# Patient Record
Sex: Female | Born: 1965 | Race: White | Hispanic: No | Marital: Married | Smoking: Former smoker
Health system: Southern US, Community
[De-identification: ages and names within clinical notes are randomized; demographics above are authoritative.]

## PROBLEM LIST (undated history)

## (undated) DIAGNOSIS — E785 Hyperlipidemia, unspecified: Secondary | ICD-10-CM

## (undated) DIAGNOSIS — M7530 Calcific tendinitis of unspecified shoulder: Secondary | ICD-10-CM

## (undated) DIAGNOSIS — S069XAA Unspecified intracranial injury with loss of consciousness status unknown, initial encounter: Secondary | ICD-10-CM

## (undated) DIAGNOSIS — S069X9A Unspecified intracranial injury with loss of consciousness of unspecified duration, initial encounter: Secondary | ICD-10-CM

## (undated) DIAGNOSIS — I2510 Atherosclerotic heart disease of native coronary artery without angina pectoris: Secondary | ICD-10-CM

## (undated) DIAGNOSIS — I2102 ST elevation (STEMI) myocardial infarction involving left anterior descending coronary artery: Secondary | ICD-10-CM

## (undated) DIAGNOSIS — M7520 Bicipital tendinitis, unspecified shoulder: Secondary | ICD-10-CM

## (undated) DIAGNOSIS — M47812 Spondylosis without myelopathy or radiculopathy, cervical region: Secondary | ICD-10-CM

## (undated) DIAGNOSIS — F070 Personality change due to known physiological condition: Secondary | ICD-10-CM

## (undated) DIAGNOSIS — I10 Essential (primary) hypertension: Secondary | ICD-10-CM

## (undated) DIAGNOSIS — I5042 Chronic combined systolic (congestive) and diastolic (congestive) heart failure: Secondary | ICD-10-CM

## (undated) DIAGNOSIS — Z720 Tobacco use: Secondary | ICD-10-CM

## (undated) HISTORY — DX: Bicipital tendinitis, unspecified shoulder: M75.20

## (undated) HISTORY — PX: TUBAL LIGATION: SHX77

## (undated) HISTORY — DX: Unspecified intracranial injury with loss of consciousness of unspecified duration, initial encounter: S06.9X9A

## (undated) HISTORY — DX: Spondylosis without myelopathy or radiculopathy, cervical region: M47.812

## (undated) HISTORY — PX: HERNIA REPAIR: SHX51

## (undated) HISTORY — DX: Calcific tendinitis of unspecified shoulder: M75.30

## (undated) HISTORY — PX: COLON SURGERY: SHX602

## (undated) HISTORY — DX: Unspecified intracranial injury with loss of consciousness status unknown, initial encounter: S06.9XAA

## (undated) HISTORY — DX: Personality change due to known physiological condition: F07.0

---

## 2000-06-07 ENCOUNTER — Emergency Department (HOSPITAL_COMMUNITY): Admission: EM | Admit: 2000-06-07 | Discharge: 2000-06-07 | Payer: Self-pay | Admitting: Emergency Medicine

## 2000-06-07 ENCOUNTER — Encounter: Payer: Self-pay | Admitting: Emergency Medicine

## 2001-10-31 ENCOUNTER — Emergency Department (HOSPITAL_COMMUNITY): Admission: EM | Admit: 2001-10-31 | Discharge: 2001-11-01 | Payer: Self-pay | Admitting: Emergency Medicine

## 2001-11-01 ENCOUNTER — Encounter: Payer: Self-pay | Admitting: Emergency Medicine

## 2004-03-09 ENCOUNTER — Emergency Department (HOSPITAL_COMMUNITY): Admission: EM | Admit: 2004-03-09 | Discharge: 2004-03-09 | Payer: Self-pay | Admitting: Emergency Medicine

## 2004-05-10 ENCOUNTER — Encounter: Admission: RE | Admit: 2004-05-10 | Discharge: 2004-08-08 | Payer: Self-pay | Admitting: Orthopaedic Surgery

## 2006-05-04 ENCOUNTER — Inpatient Hospital Stay (HOSPITAL_COMMUNITY): Admission: AC | Admit: 2006-05-04 | Discharge: 2006-05-18 | Payer: Self-pay

## 2006-05-09 ENCOUNTER — Ambulatory Visit: Payer: Self-pay | Admitting: Physical Medicine & Rehabilitation

## 2006-05-18 ENCOUNTER — Ambulatory Visit: Payer: Self-pay | Admitting: Physical Medicine & Rehabilitation

## 2006-05-18 ENCOUNTER — Inpatient Hospital Stay (HOSPITAL_COMMUNITY)
Admission: RE | Admit: 2006-05-18 | Discharge: 2006-06-21 | Payer: Self-pay | Admitting: Physical Medicine & Rehabilitation

## 2006-05-18 ENCOUNTER — Encounter: Payer: Self-pay | Admitting: Vascular Surgery

## 2006-05-25 ENCOUNTER — Ambulatory Visit: Payer: Self-pay | Admitting: Internal Medicine

## 2006-06-27 ENCOUNTER — Encounter
Admission: RE | Admit: 2006-06-27 | Discharge: 2006-09-25 | Payer: Self-pay | Admitting: Physical Medicine & Rehabilitation

## 2006-07-27 ENCOUNTER — Encounter
Admission: RE | Admit: 2006-07-27 | Discharge: 2006-10-25 | Payer: Self-pay | Admitting: Physical Medicine & Rehabilitation

## 2006-07-27 ENCOUNTER — Ambulatory Visit: Payer: Self-pay | Admitting: Physical Medicine & Rehabilitation

## 2006-08-16 ENCOUNTER — Encounter (HOSPITAL_COMMUNITY)
Admission: RE | Admit: 2006-08-16 | Discharge: 2006-09-15 | Payer: Self-pay | Admitting: Physical Medicine & Rehabilitation

## 2006-09-04 ENCOUNTER — Ambulatory Visit: Payer: Self-pay | Admitting: Physical Medicine & Rehabilitation

## 2006-09-18 ENCOUNTER — Encounter (HOSPITAL_COMMUNITY)
Admission: RE | Admit: 2006-09-18 | Discharge: 2006-10-18 | Payer: Self-pay | Admitting: Physical Medicine & Rehabilitation

## 2006-10-19 ENCOUNTER — Ambulatory Visit: Payer: Self-pay | Admitting: Physical Medicine & Rehabilitation

## 2007-01-22 ENCOUNTER — Encounter
Admission: RE | Admit: 2007-01-22 | Discharge: 2007-02-08 | Payer: Self-pay | Admitting: Physical Medicine & Rehabilitation

## 2007-02-07 ENCOUNTER — Ambulatory Visit: Payer: Self-pay | Admitting: Physical Medicine & Rehabilitation

## 2007-05-07 ENCOUNTER — Encounter
Admission: RE | Admit: 2007-05-07 | Discharge: 2007-07-09 | Payer: Self-pay | Admitting: Physical Medicine & Rehabilitation

## 2007-05-07 ENCOUNTER — Ambulatory Visit: Payer: Self-pay | Admitting: Physical Medicine & Rehabilitation

## 2007-07-29 ENCOUNTER — Encounter
Admission: RE | Admit: 2007-07-29 | Discharge: 2007-07-31 | Payer: Self-pay | Admitting: Physical Medicine & Rehabilitation

## 2007-07-31 ENCOUNTER — Ambulatory Visit: Payer: Self-pay | Admitting: Physical Medicine & Rehabilitation

## 2007-09-25 ENCOUNTER — Ambulatory Visit: Payer: Self-pay | Admitting: Physical Medicine & Rehabilitation

## 2007-09-25 ENCOUNTER — Encounter
Admission: RE | Admit: 2007-09-25 | Discharge: 2007-10-30 | Payer: Self-pay | Admitting: Physical Medicine & Rehabilitation

## 2007-09-29 ENCOUNTER — Ambulatory Visit (HOSPITAL_COMMUNITY)
Admission: RE | Admit: 2007-09-29 | Discharge: 2007-09-29 | Payer: Self-pay | Admitting: Physical Medicine & Rehabilitation

## 2007-10-30 ENCOUNTER — Ambulatory Visit (HOSPITAL_COMMUNITY)
Admission: RE | Admit: 2007-10-30 | Discharge: 2007-10-30 | Payer: Self-pay | Admitting: Physical Medicine & Rehabilitation

## 2007-10-30 ENCOUNTER — Ambulatory Visit: Payer: Self-pay | Admitting: Physical Medicine & Rehabilitation

## 2008-01-03 ENCOUNTER — Encounter
Admission: RE | Admit: 2008-01-03 | Discharge: 2008-01-06 | Payer: Self-pay | Admitting: Physical Medicine & Rehabilitation

## 2008-01-06 ENCOUNTER — Ambulatory Visit: Payer: Self-pay | Admitting: Physical Medicine & Rehabilitation

## 2008-03-02 ENCOUNTER — Encounter
Admission: RE | Admit: 2008-03-02 | Discharge: 2008-03-02 | Payer: Self-pay | Admitting: Physical Medicine & Rehabilitation

## 2008-03-02 ENCOUNTER — Ambulatory Visit: Payer: Self-pay | Admitting: Physical Medicine & Rehabilitation

## 2008-06-02 ENCOUNTER — Encounter
Admission: RE | Admit: 2008-06-02 | Discharge: 2008-06-02 | Payer: Self-pay | Admitting: Physical Medicine & Rehabilitation

## 2008-06-03 ENCOUNTER — Ambulatory Visit: Payer: Self-pay | Admitting: Physical Medicine & Rehabilitation

## 2008-08-26 ENCOUNTER — Encounter
Admission: RE | Admit: 2008-08-26 | Discharge: 2008-08-26 | Payer: Self-pay | Admitting: Physical Medicine & Rehabilitation

## 2008-08-26 ENCOUNTER — Ambulatory Visit: Payer: Self-pay | Admitting: Physical Medicine & Rehabilitation

## 2008-11-24 ENCOUNTER — Encounter
Admission: RE | Admit: 2008-11-24 | Discharge: 2009-02-22 | Payer: Self-pay | Admitting: Physical Medicine & Rehabilitation

## 2008-12-01 ENCOUNTER — Ambulatory Visit: Payer: Self-pay | Admitting: Physical Medicine & Rehabilitation

## 2009-02-22 ENCOUNTER — Encounter
Admission: RE | Admit: 2009-02-22 | Discharge: 2009-04-15 | Payer: Self-pay | Admitting: Physical Medicine & Rehabilitation

## 2009-02-23 ENCOUNTER — Ambulatory Visit: Payer: Self-pay | Admitting: Physical Medicine & Rehabilitation

## 2009-05-24 ENCOUNTER — Encounter
Admission: RE | Admit: 2009-05-24 | Discharge: 2009-05-25 | Payer: Self-pay | Admitting: Physical Medicine & Rehabilitation

## 2009-05-25 ENCOUNTER — Ambulatory Visit: Payer: Self-pay | Admitting: Physical Medicine & Rehabilitation

## 2009-05-25 ENCOUNTER — Ambulatory Visit (HOSPITAL_COMMUNITY)
Admission: RE | Admit: 2009-05-25 | Discharge: 2009-05-25 | Payer: Self-pay | Admitting: Physical Medicine & Rehabilitation

## 2009-09-16 ENCOUNTER — Encounter
Admission: RE | Admit: 2009-09-16 | Discharge: 2009-09-21 | Payer: Self-pay | Admitting: Physical Medicine & Rehabilitation

## 2009-09-21 ENCOUNTER — Ambulatory Visit: Payer: Self-pay | Admitting: Physical Medicine & Rehabilitation

## 2010-01-05 ENCOUNTER — Encounter
Admission: RE | Admit: 2010-01-05 | Discharge: 2010-01-11 | Payer: Self-pay | Source: Home / Self Care | Attending: Physical Medicine & Rehabilitation | Admitting: Physical Medicine & Rehabilitation

## 2010-01-11 ENCOUNTER — Ambulatory Visit: Payer: Self-pay | Admitting: Physical Medicine & Rehabilitation

## 2010-05-03 ENCOUNTER — Encounter
Admission: RE | Admit: 2010-05-03 | Discharge: 2010-05-24 | Payer: Self-pay | Source: Home / Self Care | Attending: Physical Medicine & Rehabilitation | Admitting: Physical Medicine & Rehabilitation

## 2010-05-06 ENCOUNTER — Ambulatory Visit
Admission: RE | Admit: 2010-05-06 | Discharge: 2010-05-06 | Payer: Self-pay | Source: Home / Self Care | Attending: Physical Medicine & Rehabilitation | Admitting: Physical Medicine & Rehabilitation

## 2010-05-15 ENCOUNTER — Encounter: Payer: Self-pay | Admitting: Physical Medicine & Rehabilitation

## 2010-09-06 NOTE — Assessment & Plan Note (Signed)
Julie Simpson is back regarding her brain injury.  She has resumed medications  including Aricept, Effexor and bromocriptine as we indicated at the last  visit.  Husband feels that she has been a bit better initiating more  activities, cooking, housework, etc.  She needs less assistance with  bathing and dressing.  She still has problems with her balance.  She is  using a walker, although there are no wheels with it.  Memory is up and  down.  She still needs her memory book and is using it more.  She has  more problems with trivial information and seemed to remember more  important things that have meaning and substance to her.  She rates her  pain a 9 out of 10 and pains in the neck, mid back and legs.  Her knees  are painful as well.  Pain is described as stabbing, sharp, burning,  constant aching.  Pain interferes with general activity, relations with  others, enjoyment of life on a moderate level.  Sleep is poor.  Trazodone worked initially for her but is no longer helpful.   REVIEW OF SYSTEMS:  Notable for bowel control problems, trouble walking,  spasms, depression, weight gain, constipation, abdominal pain, sleep  apnea.  Other pertinent problems are listed above, and full review is in  the written health and history section of the chart.   SOCIAL HISTORY:  The patient is married, living with her husband.  He  remains extremely supportive.   PHYSICAL EXAMINATION:  Blood pressure is 123/71, pulse is 67,  respiratory rate 18.  She is sating 96% on room air.  The patient is pleasant, more alert, and interactive today.  She needed  extra time for month and year.  She could not remember the day.  She  recalled my name after a few minutes.  She had some remembrance randomly  of medications and responds to some of her review of systems items.  She  has problems with questions such as what did she do yesterday, what are  you doing daily for your activities, etc.  She walks shuffling, wide-  based gait.  She seems to be antalgic on either leg.  She uses a walker  for support.  She tends to lean backward at times.  HEART:  Regular.  CHEST:  Clear.  ABDOMEN:  Soft, nontender.  Cranial nerve exam is grossly intact.  Weight is still up.   ASSESSMENT:  1. Severe traumatic brain injury.  2. Anxiety disorder.  3. Chronic pain in the back, neck, shoulder, knees.  4. Hypertension.  5. Reflux disease.   PLAN:  1. Continue Aricept and Effexor.  2. Will refill hydrocodone which was given to her by her family doctor      at 5/325 one q 6 hours #60.  3. Add Celebrex 200 mg q day for pain/arthritis.  4. Will increase bromocriptine to 10 mg b.i.d.  5. Continue activity, etc.  I asked her husband to work on getting her      wheels for her walker which I think would help her gait.  6. Continue memory book.  7. Will check multiple labs to assess hormonal balance and issues that      might relate to her cognition and continued deficits including      estrogen, progesterone, testosterone, cortisol level, thyroid      function panel, C-met      and CBC.  8. I will see her back in about two months'  time.  Husband will call      with any questions or problems.      Ranelle Oyster, M.D.  Electronically Signed     ZTS/MedQ  D:  07/31/2007 10:15:02  T:  07/31/2007 11:27:23  Job #:  [id number]

## 2010-09-06 NOTE — Assessment & Plan Note (Signed)
FOLLOWUP OFFICE NOTE   Julie Simpson is back regarding traumatic brain injury.  Husband states that she  has been a bit better with her day to day memory at home, but still is  profoundly impaired.  She remains anxious out of the house and depressed  still at home.  She has come off Tegretol without dramatic effects.  He  has decreased her Xanax to twice a day.  She remains on Aricept, as  prescribed by me.  She apparently is off the Provigil now.  She has been  in and out of therapy.  It sounds as if the family is having problems  making ends meet financially and they live day to day.  The patient  walks with her cane.  She still complains of right leg and back pain as  well as neck pain.   REVIEW OF SYSTEMS:  Notable for numbness, trouble walking, restless  sleep, confusion, depression.  Other pertinent positives are mentioned  above.   SOCIAL HISTORY:  As mentioned above.   PHYSICAL EXAM:  Blood pressure 156/81, pulse 62, respiratory rate 18,  she is satting 97% on room air.  The patient is pleasant, alert and oriented x3.  Affect is bright and  appropriate.  She continues to walk with a limp, favoring the right side.  Her cane is  a bit high for her.  We adjusted it downwards and she did better.  Balance is still fair to poor.  She continues to lack significant  insight and awareness.  Short term memory remains very poor.  She cannot  remember my name once again today.  She seemed to recall prior events,  but was not consistent what so ever with these.  She asked about an  event while in the room today and it was something we had discussed on  her last visit back in May.  Cranial nerves exam is generally intact.  Strength is 3+/5 to 4/5 on the right side with inconsistent effort.  HEART:  Regular.  CHEST:  Clear.  ABDOMEN:  Soft and nontender.   ASSESSMENT:  1. 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  2. Anxiety disorder.  3. Right shoulder and  cervical pain.  4. History of tachycardia and hypertension.  5. History of gastroesophageal reflux disease.   PLAN:  1. Continue Aricept for memory.  We will start bromocriptine for      attention and arousal.  2. Replace Lexapro with Effexor going up to 75 mg t.i.d. after 1      week's time.  3. Begin trazodone 50 to 100 mg once again for sleep, as she has come      off of this.  4. Will send the patient for lab work to look at any hormonal or other      metabolic deficiencies including a CBC, CMET, thyroid panel,      hormone panel, B12, and folate level, et Karie Soda.  5. I will see her back in about 3 months' time.  I asked the husband      to call me with any questions.  I will call him with any      information from the lab work.      Ranelle Oyster, M.D.  Electronically Signed    ZTS/MedQ  D:  10/24/2006 11:19:49  T:  10/24/2006 16:30:25  Job #:  [id number]

## 2010-09-06 NOTE — Assessment & Plan Note (Signed)
Julie Simpson is back regarding her brain injury.  Husband states that she  is doing well at home and not been able to get her to therapy due to  change in his work.  He is now having to work 10-hour days, 6 days a  week.  Her MRI of the cervical spine was essentially negative except for  a small bulge at C6-C7.  The patient seems to be doing well at home  although does sleep during the day quite a bit.  She remains anxious and  depressed.  She does use her memory book somewhat.  Her sleep is fair.  The patient rates her pain as 7-8/10 which is down from the 9/10 in  July.   REVIEW OF SYSTEMS:  Notable for numbness, tremor, bladder, or bowel  control issues, for walking and depression.  She does walk with a walker  and/or cane in the house.  Husband has been pushing her and does feel  that her balance has somewhat improved.   SOCIAL HISTORY:  The patient is married and living with her husband.  He  is in a pinch as far as his job is concerned due to the poor economy.   PHYSICAL EXAMINATION:  VITAL SIGNS:  Blood pressure is 119/83, pulse is  96, respiratory rate 18, and sating 95% on room air.  GENERAL:  The patient is pleasant, very flat, however, lacks initiation  and awareness for the most part.  She walks better today.  I found using  her quad cane minimally.  She continues to use a wide-based cane but  seemed more steady and shifting weight better.  She moved her neck with  minimal discomfort today.  HEART:  Regular.  CHEST:  Clear.  ABDOMEN:  Soft and nontender.   ASSESSMENT:  1. Traumatic brain injury.  2. Depression/anxiety disorder.  3. Chronic pain syndrome/centralized pain.  4. Hypertension.   PLAN:  1. Advised increased activity at home and more structure to her      daytime activities.  I suggested making a calendar for weekly      chores.  She also needs to use her memory book regularly.  2. We will start Ritalin 5 mg daily at 7 a.m. and 12 noon.  3. Add Celexa at  night for mood, otherwise she stopped her Effexor.  I      would like to drop her Xanax as well decrease oxycodone.  4. I discussed the fact with the patient that she is going to have to      want to improve if she really has any hopes of improving in      reality.  5. I will see her back in 2 months.      Julie Simpson, M.D.  Electronically Signed     ZTS/MedQ  D:  01/06/2008 12:24:44  T:  01/07/2008 06:44:35  Job #:  [id number]
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
## 2010-09-06 NOTE — Assessment & Plan Note (Signed)
Julie Simpson is back regarding her brain injury.  I last saw her in October.  She comes back off several medications as her husband was afraid that  these were causing nausea.  She is off her Aricept, Effexor,  bromocriptine, and Ultram.  She stopped the trazodone.  Essentially she  is taking her propranolol, diltiazem, lisinopril, and omeprazole.  Omeprazole does not work as well and has run out as well.  She uses  Lidoderm patches p.r.n. with minimal benefits.  Husband reports  decreased mobility and cognition over the last few months' time.  The  patient describes persistent pain, particularly in the neck, left  shoulder, back, and legs.  Husband states that she rarely gets out of  the bed and, when she does, she is limited with her activities around  the home due to physical and cognitive deficits.  The patient rates her  pain an 8/10 today and states it interferes with her general activity,  relations with others, and enjoyment of life on a severe level.  The  patient's husband feels that she is depressed, although she denies this.   REVIEW OF SYSTEMS:  Notable for trouble walking, spasms, bowel control  problems, confusion, depression, weight gain, night sweats,  constipation.  Other pertinent positives are above.   SOCIAL HISTORY:  The patient is married, living with her husband.  Husband works nearby.  Her daughter is coming back home after an  extended psychiatric stay.   PHYSICAL EXAMINATION:  Blood pressure is 135/85, pulse is 68,  respiratory rate 18.  She is satting 97% on room air.  The patient is  pleasant but flat and has poor insight and awareness.  She does have  some improvement in her memory I would say, overall, with some  recollections of some items and activities we discussed today.  She is  fairly good with conversation, although the substance of this  conversation is generally shallow.  She has some pain in her neck with  movement and seems to be fixated on her pain  in the neck and back.  She  moves all 4 extremities.  She did have left shoulder pain with rotator  cuff impingement testing.  HEART:  Regular.  CHEST:  Clear.  ABDOMEN:  Soft, nontender.  STRENGTH:  Generally 4/5 with some giveaway weakness.  She shuffles with  her gait and had difficulty with changes in direction.  She was walking  with her quad cane today.  Weight is still increased.   ASSESSMENT:  1. Severe traumatic brain injury.  2. Anxiety disorder.  3. History of chronic pain in shoulder, neck, back, and knees.  4. Hypertension.  5. Reflux disease.   PLAN:  1. I asked the patient's husband to systematically begin Aricept,      Effexor, bromocriptine and, perhaps, Ultram in that order to see if      any of these were particularly causing her nausea.  I think her      decline in memory and cognition has coincided with the stopping of      these meds.  2. Asked her to keep a memory book with her at all times which she can      initiate the use of.  3. Encouraged modalities such as heat, ice, stretching, massage for      her back and neck.  4. Encouraged activities around the home on a scheduled basis to      improve her movement and activity level.  5.  I will see her back in about 3 months' time.      Ranelle Oyster, M.D.  Electronically Signed     ZTS/MedQ  D:  05/08/2007 13:10:01  T:  05/08/2007 13:54:51  Job #:  [id number]

## 2010-09-06 NOTE — Assessment & Plan Note (Signed)
Julie Simpson is back regarding her brain injury and pain.  She is doing a bit  better with the bromocriptine for memory and mood.  She is using a  memory book a bit more but not entirely consistently.  She had a fall  out of bed the other day in the middle of the night and injured her  back, and she is having more pain there recently.  She rates her pain on  average a 9/10; describes it as a sharp, burning, stabbing, and  constant.  She has pain in the back and to both legs as well as in the  neck and shoulder regions.  Lab work we performed at the last visit was  normal, essentially.  It sounds as though she is going for a thyroid  scan of some sort as her family physician was concerned about  enlargement in the thyroid area.  The patient's mood in general per the  husband seems a bit better.  She is initiating a bit more around the  house but still did not get much in the way of exercise.   REVIEW OF SYSTEMS:  Notable for numbness, trouble walking, spasms,  depression, confusion, anxiety, and weight gain.  Other pertinent  positives are as above.  Full reviews in written health history section  of the chart.   SOCIAL HISTORY:  The patient is married and living with her husband.  She is still smoking half pack per day of cigarettes; her husband  stopped.   PHYSICAL EXAM:  Blood pressure 123/86, pulse 76, respiratory rate 18.  She is sating 97% on room air.  The patient is pleasant and alert.  The patient remembered my name after  some extra time today.  She seemed to have small recollection of events  around the house, some issues, and spoke up for herself on occasions.  Her back is tender to palpation, particularly tender with flexion, more  so with the extension, although both movements caused discomfort today.  She had pain with palpation over the lumbar paraspinal from facets at L4  through S1.  She walks with a wide-based sometimes shuffling gait  pattern still.  She uses a prong cane  for support.  HEART:  Regular.  CHEST:  Clear.  ABDOMEN:  Soft and nontender.  Cranial nerve exam is grossly intact.   I palpated the neck, and there were no obvious masses or nodules.  She  appeared to be somewhat swollen on the left, but this is due to weight  gain and her neck positioning, which essentially was favoring the left  side today.   ASSESSMENT:  1. Traumatic brain injury.  2. Anxiety disorder.  3. 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  4. Hypertension.  5. Reflux disease.   PLAN:  1. We will set her up for an MRI of the lumbar spine.  We will assess      for degenerative disk disease and spondylosis.  2. Continue hydrocodone for breakthrough pain.  3. Continue bromocriptine 10 mg b.i.d. as well as Aricept and Effexor.  4. Encouraged more regular use of the memory book and calendar.  5. We will discuss further treatment of back depending on the MRI      scanning.      Ranelle Oyster, M.D.  Electronically Signed     ZTS/MedQ  D:  09/25/2007 13:23:02  T:  09/26/2007 10:13:00  Job #:  [id number]
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
## 2010-10-31 ENCOUNTER — Encounter: Payer: Medicare Other | Attending: Physical Medicine & Rehabilitation | Admitting: Physical Medicine & Rehabilitation

## 2010-10-31 DIAGNOSIS — F17200 Nicotine dependence, unspecified, uncomplicated: Secondary | ICD-10-CM | POA: Insufficient documentation

## 2010-10-31 DIAGNOSIS — X58XXXS Exposure to other specified factors, sequela: Secondary | ICD-10-CM | POA: Insufficient documentation

## 2010-10-31 DIAGNOSIS — S069X9S Unspecified intracranial injury with loss of consciousness of unspecified duration, sequela: Secondary | ICD-10-CM | POA: Insufficient documentation

## 2010-10-31 DIAGNOSIS — M25569 Pain in unspecified knee: Secondary | ICD-10-CM | POA: Insufficient documentation

## 2010-10-31 DIAGNOSIS — G890 Central pain syndrome: Secondary | ICD-10-CM | POA: Insufficient documentation

## 2010-10-31 DIAGNOSIS — S069X9A Unspecified intracranial injury with loss of consciousness of unspecified duration, initial encounter: Secondary | ICD-10-CM

## 2010-10-31 DIAGNOSIS — F070 Personality change due to known physiological condition: Secondary | ICD-10-CM

## 2010-10-31 DIAGNOSIS — M542 Cervicalgia: Secondary | ICD-10-CM | POA: Insufficient documentation

## 2010-10-31 DIAGNOSIS — S069XAS Unspecified intracranial injury with loss of consciousness status unknown, sequela: Secondary | ICD-10-CM | POA: Insufficient documentation

## 2010-10-31 DIAGNOSIS — M25519 Pain in unspecified shoulder: Secondary | ICD-10-CM | POA: Insufficient documentation

## 2010-10-31 DIAGNOSIS — M47812 Spondylosis without myelopathy or radiculopathy, cervical region: Secondary | ICD-10-CM

## 2010-11-03 NOTE — Assessment & Plan Note (Signed)
Julie Simpson is back regarding her brain injury.  She is still having pain in her left neck and shoulder.  She complains a lot of it at nighttime. She is trying to work somewhat on posture.  Husband states that she has been more active as a whole.  She is still having issues with memory although, she tries to keep some memory aids and husband works repeatedly with her on cues and memory exercises.  She rates her pain overall 8/10 today, describes stabbing, aching.  Pain interferes with general activities, in relations with others, enjoyment of life on a moderate level.  REVIEW OF SYSTEMS:  Notable for trouble walking, spasms, depression, constipation.  Full 12-point review is in the written health and history section of the chart.  SOCIAL HISTORY:  The patient is married, living with her husband.  She is still smoking.  Husband tells me that she is now refraining from any illicit drugs including marijuana.  PHYSICAL EXAMINATION:  VITAL SIGNS:  Blood pressure is 133/81, pulse 68, respiratory rate is 16, she is satting 96% on room air. GENERAL:  The patient is pleasant and alert.  She still walks with somewhat of a shuffling gait. NEUROLOGIC:  Posture was better overall, although she still sits and stands with a head forward position.  She has better focus I felt and awareness overall.  Memory still is inhibited for short-term information.  She has generalized tenderness throughout the left trapezius, sternocleidomastoid, levator scapulae into the rhomboids.  No focal trigger points were noted and no asymmetries from left-to-right really noted today.  Spurling's test was negative.  Compression test had caused some more pain in the left neck.  Her strength in both arms equal as her sensory exam. HEART:  Regular. CHEST:  Clear. ABDOMEN:  Soft, nontender.  ASSESSMENT: 1. Traumatic brain injury, centralized pain syndrome/compression. 2. Bilateral knee pain. 3. Left shoulder girdle  pain/cervicalgia.  Exam suspicious for facet     causes of pain. 4. History of marijuana use.  PLAN: 1. We will order MRI of the cervical spine to rule out lumbar disk     degeneration and facet disease.  I see no radicular signs on     examination today.  The patient may be a candidate for localized     injections based on findings.  We have tried a lot of therapy and     other modalities in the past and we have tried trigger point     injections which provided no relief. 2. Regarding her memory and cognition, we will increase Strattera to     36 mg initially for 4-5 days and then go to 40 mg tablet once a     day.  Hopefully, by improving her attention and arousal, she will     display better short-term memory.  I discussed with Lorece the fact     that her memory will "improve" any further at this point after     brain injury.  We can improve her attention and awareness which     hopefully will help her memory retention. 3. I will see her back pending her studies.     Ranelle Oyster, M.D. Electronically Signed    ZTS/MedQ D:  10/31/2010 12:16:25  T:  10/31/2010 21:46:30  Job #:  [id number]

## 2010-11-07 ENCOUNTER — Other Ambulatory Visit: Payer: Self-pay | Admitting: Physical Medicine & Rehabilitation

## 2010-11-07 DIAGNOSIS — M542 Cervicalgia: Secondary | ICD-10-CM

## 2010-11-07 DIAGNOSIS — M25519 Pain in unspecified shoulder: Secondary | ICD-10-CM

## 2010-11-10 ENCOUNTER — Ambulatory Visit (HOSPITAL_COMMUNITY)
Admission: RE | Admit: 2010-11-10 | Discharge: 2010-11-10 | Disposition: A | Discharge status: Home / Self Care | Payer: Medicare Other | Source: Ambulatory Visit | Attending: Physical Medicine & Rehabilitation | Admitting: Physical Medicine & Rehabilitation

## 2010-11-10 DIAGNOSIS — M5020 Other cervical disc displacement, unspecified cervical region: Secondary | ICD-10-CM | POA: Insufficient documentation

## 2010-11-10 DIAGNOSIS — M542 Cervicalgia: Secondary | ICD-10-CM | POA: Insufficient documentation

## 2010-11-10 DIAGNOSIS — M5030 Other cervical disc degeneration, unspecified cervical region: Secondary | ICD-10-CM | POA: Insufficient documentation

## 2010-11-10 DIAGNOSIS — M25519 Pain in unspecified shoulder: Secondary | ICD-10-CM | POA: Insufficient documentation

## 2011-04-26 ENCOUNTER — Encounter: Payer: Medicare Other | Attending: Physical Medicine & Rehabilitation | Admitting: Physical Medicine & Rehabilitation

## 2011-04-26 DIAGNOSIS — G890 Central pain syndrome: Secondary | ICD-10-CM | POA: Insufficient documentation

## 2011-04-26 DIAGNOSIS — F17200 Nicotine dependence, unspecified, uncomplicated: Secondary | ICD-10-CM | POA: Insufficient documentation

## 2011-04-26 DIAGNOSIS — F3289 Other specified depressive episodes: Secondary | ICD-10-CM | POA: Insufficient documentation

## 2011-04-26 DIAGNOSIS — S069X9A Unspecified intracranial injury with loss of consciousness of unspecified duration, initial encounter: Secondary | ICD-10-CM

## 2011-04-26 DIAGNOSIS — X58XXXS Exposure to other specified factors, sequela: Secondary | ICD-10-CM | POA: Insufficient documentation

## 2011-04-26 DIAGNOSIS — S069XAS Unspecified intracranial injury with loss of consciousness status unknown, sequela: Secondary | ICD-10-CM | POA: Insufficient documentation

## 2011-04-26 DIAGNOSIS — M7650 Patellar tendinitis, unspecified knee: Secondary | ICD-10-CM

## 2011-04-26 DIAGNOSIS — M25569 Pain in unspecified knee: Secondary | ICD-10-CM | POA: Insufficient documentation

## 2011-04-26 DIAGNOSIS — S069X9S Unspecified intracranial injury with loss of consciousness of unspecified duration, sequela: Secondary | ICD-10-CM | POA: Insufficient documentation

## 2011-04-26 DIAGNOSIS — M1710 Unilateral primary osteoarthritis, unspecified knee: Secondary | ICD-10-CM

## 2011-04-26 DIAGNOSIS — F329 Major depressive disorder, single episode, unspecified: Secondary | ICD-10-CM | POA: Insufficient documentation

## 2011-04-27 NOTE — Assessment & Plan Note (Signed)
Julie Simpson is back regarding her multiple issues.  I last saw her in July. Her biggest complaint today is knee pain.  She is trying to walk more with the knees which is too tender.  We had done x-rays about 2 years ago which were really unremarkable.  She is using some Bengay and some topical treatments at home which only provides minimal results.  Husband tries to push her to move and exercise more and she is doing a bit better as a whole, but still is not very active.  Still has ongoing issues with memory and tries to use a memory book, but has a hard time keeping up with them.  REVIEW OF SYSTEMS:  Notable for the above.  Full 12-point review is in the written health and history section of the chart.  Mood perhaps has been a bit better.  SOCIAL HISTORY:  The patient is married.  Husband is with her today. She is still smoking a pack of cigarettes per day.  PHYSICAL EXAMINATION:  VITAL SIGNS:  Blood pressure 142/85, pulse 66, respiratory rate 16, she is satting 94% on room air. GENERAL:  The patient is pleasant, alert, and oriented.  She needs cuing though for short-term memory issues and attention.  I had her stand for me today and she needed extra time to go from seated to standing due to pain.  There is a mild joint effusion on the right knee and minimal crepitus seen.  There was some mild joint line tenderness with palpation.  Valgus and varus stresses as well as meniscal stresses did not seem to overly provoke pain response.  She seemed to have more pain superiorly along the quad tendon and bursa.  The patient walks with knee and uses a wide-based gait and is bit apprehensive and anxious as a whole. HEART:  Regular. CHEST:  Clear. ABDOMEN:  Soft, nontender.  She has gained considerable weight as a whole.  ASSESSMENT: 1. Traumatic brain injury, centralized pain syndrome, and depression. 2. Bilateral knee pain, which may be quadriceps, tendinitis/bursitis.  PLAN:  Given that she  had completely normal x-rays from 2 years ago and her history of pain sensitization, I would like to stay conservative here.  Voltaren gel would be a good alternative for her, but financially their plan will not cover this.  I decided to write her naproxen 500 mg b.i.d. with food for now.  Recommended ice t.i.d. and as well as strengthening exercises for the quadriceps, hamstrings in legs in general.  If she fails to progress, we can look at other options including injection and further diagnostic workup.  I will see her back in 6 weeks.     Ranelle Oyster, M.D. Electronically Signed    ZTS/MedQ D:  04/26/2011 11:46:32  T:  04/26/2011 20:11:20  Job #:  [id number]

## 2011-06-06 ENCOUNTER — Encounter: Payer: Medicare Other | Attending: Physical Medicine & Rehabilitation | Admitting: Physical Medicine & Rehabilitation

## 2011-06-06 DIAGNOSIS — M542 Cervicalgia: Secondary | ICD-10-CM | POA: Insufficient documentation

## 2011-06-06 DIAGNOSIS — M47812 Spondylosis without myelopathy or radiculopathy, cervical region: Secondary | ICD-10-CM

## 2011-06-06 DIAGNOSIS — M7650 Patellar tendinitis, unspecified knee: Secondary | ICD-10-CM | POA: Insufficient documentation

## 2011-06-06 DIAGNOSIS — M25569 Pain in unspecified knee: Secondary | ICD-10-CM | POA: Insufficient documentation

## 2011-06-06 DIAGNOSIS — F070 Personality change due to known physiological condition: Secondary | ICD-10-CM

## 2011-06-06 DIAGNOSIS — Z87820 Personal history of traumatic brain injury: Secondary | ICD-10-CM | POA: Insufficient documentation

## 2011-06-06 DIAGNOSIS — S069X9A Unspecified intracranial injury with loss of consciousness of unspecified duration, initial encounter: Secondary | ICD-10-CM

## 2011-06-06 DIAGNOSIS — G890 Central pain syndrome: Secondary | ICD-10-CM | POA: Insufficient documentation

## 2011-06-09 NOTE — Assessment & Plan Note (Signed)
Misty Stanley is back regarding her multiple pain complaints.  She comes back with knee pain again.  The gel helps somewhat.  She is trying to work on some exercises at home, was not consistent with these.  She notes a lot of pain in both knees especially when she sits for long periods of time or has to go up inclines, steps, etc.  Her pain is an 8/10, described as stabbing and aching.  REVIEW OF SYSTEMS:  Notable for trouble walking, weight gain, easy bleeding.  Full 12-point review is in the written health and history section of the chart.  SOCIAL HISTORY:  The patient is married, living with her husband.  She is smoking a pack of cigarettes per day.  PHYSICAL EXAMINATION:  VITAL SIGNS:  Blood pressure is 132/79, pulse 68, respiratory rate is 16, and she is saturating 98% on room air. GENERAL:  The patient is pleasant, alert and oriented x3.  Affect is generally bright and appropriate. MUSCULOSKELETAL:  She has minimal joint effusions that I could see on either knee.  No crepitus was seen.  She has minimal joint line pain. Meniscal maneuvers were negative.  She did have pain with resisted knee extension and with pain upon the palpation of the patellar tendon in the infrapatellar regions.  Quad tendon area seemed to be less tender today. Generally, her strength is a bit diminished with extension at 3+ to 4/5. She is 4/5 elsewhere essentially, but inconsistent. HEART:  Regular. CHEST:  Clear. ABDOMEN:  Soft and nontender. NEUROLOGIC:  She is unchanged.  ASSESSMENT: 1. History of traumatic brain injury. 2. Bilateral knee pain with patellar tendinitis, likely some     quadriceps tendinitis, which has improved on exam today.  No     evidence of osteoarthritis in the knees. 3. Centralized pain syndrome. 4. Chronic cervicalgia.  PLAN: 1. After informed consent, we injected both patellar tendons in     infrapatellar region with 40 mg Kenalog and 3 mL of 1% lidocaine.     The patient  tolerated it well.  I expressed to the patient and her     husband the importance of strengthening her knee musculature,     particularly the quadriceps.  She needs to work on walking and     closed kinetic chain exercises as well as some open kinetic chains     against gravity.  Ice would be valuable as well as her Voltaren.     They have a walking machine apparently  that they are acquiring.     Although, I told she needs to be careful when using this from a     fall risk standpoint. 2. I will see her back in about 2 months.  They will call me with any     problems or questions in the interim.     Ranelle Oyster, M.D. Electronically Signed    ZTS/MedQ D:  06/06/2011 10:38:24  T:  06/06/2011 12:02:41  Job #:  [id number]

## 2011-08-02 ENCOUNTER — Encounter: Payer: Self-pay | Admitting: Physical Medicine & Rehabilitation

## 2011-08-02 ENCOUNTER — Encounter: Payer: Medicare Other | Attending: Physical Medicine & Rehabilitation | Admitting: Physical Medicine & Rehabilitation

## 2011-08-02 VITALS — BP 152/94 | HR 70 | Resp 16 | Ht <= 58 in | Wt 126.0 lb

## 2011-08-02 DIAGNOSIS — M25569 Pain in unspecified knee: Secondary | ICD-10-CM | POA: Insufficient documentation

## 2011-08-02 DIAGNOSIS — S069X9A Unspecified intracranial injury with loss of consciousness of unspecified duration, initial encounter: Secondary | ICD-10-CM | POA: Insufficient documentation

## 2011-08-02 DIAGNOSIS — X58XXXS Exposure to other specified factors, sequela: Secondary | ICD-10-CM | POA: Insufficient documentation

## 2011-08-02 DIAGNOSIS — M76899 Other specified enthesopathies of unspecified lower limb, excluding foot: Secondary | ICD-10-CM

## 2011-08-02 DIAGNOSIS — G890 Central pain syndrome: Secondary | ICD-10-CM | POA: Insufficient documentation

## 2011-08-02 DIAGNOSIS — M7060 Trochanteric bursitis, unspecified hip: Secondary | ICD-10-CM | POA: Insufficient documentation

## 2011-08-02 DIAGNOSIS — F3289 Other specified depressive episodes: Secondary | ICD-10-CM | POA: Insufficient documentation

## 2011-08-02 DIAGNOSIS — S069XAS Unspecified intracranial injury with loss of consciousness status unknown, sequela: Secondary | ICD-10-CM | POA: Insufficient documentation

## 2011-08-02 DIAGNOSIS — F329 Major depressive disorder, single episode, unspecified: Secondary | ICD-10-CM | POA: Insufficient documentation

## 2011-08-02 DIAGNOSIS — G8929 Other chronic pain: Secondary | ICD-10-CM | POA: Insufficient documentation

## 2011-08-02 DIAGNOSIS — S069X9S Unspecified intracranial injury with loss of consciousness of unspecified duration, sequela: Secondary | ICD-10-CM | POA: Insufficient documentation

## 2011-08-02 DIAGNOSIS — M7650 Patellar tendinitis, unspecified knee: Secondary | ICD-10-CM | POA: Insufficient documentation

## 2011-08-02 DIAGNOSIS — F17200 Nicotine dependence, unspecified, uncomplicated: Secondary | ICD-10-CM | POA: Insufficient documentation

## 2011-08-02 NOTE — Patient Instructions (Signed)

## 2011-08-02 NOTE — Progress Notes (Signed)
Subjective:    Patient ID: Julie Simpson, female    DOB: 1965-05-16, 46 y.o.   MRN: [id number]  HPI Kambri is back regarding her multiple pain issues, TBI.  She had good results with the bilateral peri-tendinous patellar tendon injections.  Knees are still feeling better since the injections.  She is using the voltaren gel as well.  Ice can be helpful as well.  She is trying to walk more. She walks a lot in the house, but doesn't get out of the house much.  She has lost weight as well. She usually walks with her cane and sometimes a walker.    She reports pain on the right side of her buttocks. This has been present since her injury.  She tries some stretching but it doesn't help much. She hasn't used ice.  Sleeping causes that pain to be much worse.  Cognition is at baseline   Pain Inventory Average Pain 8 Pain Right Now 8 My pain is intermittent, burning, stabbing and aching  In the last 24 hours, has pain interfered with the following? General activity 4 Relation with others 5 Enjoyment of life 4 What TIME of day is your pain at its worst? all of the time Sleep (in general) Fair  Pain is worse with: walking, unsure and some activites Pain improves with: rest, heat/ice and pacing activities Relief from Meds: 3  Mobility use a cane ability to climb steps?  yes do you drive?  no needs help with transfers Do you have any goals in this area?  yes  Function disabled: date disabled 2008 I need assistance with the following:  meal prep, household duties and shopping  Neuro/Psych bowel control problems trouble walking depression  Prior Studies Any changes since last visit?  no  Physicians involved in your care Any changes since last visit?  no     Review of Systems  Constitutional: Positive for unexpected weight change.       Weight gain  Gastrointestinal: Positive for constipation.  Musculoskeletal: Positive for gait problem.  Psychiatric/Behavioral: Positive for  dysphoric mood.  All other systems reviewed and are negative.       Objective:   Physical Exam  Constitutional: She appears well-developed and well-nourished.  HENT:  Head: Normocephalic and atraumatic.  Eyes: Conjunctivae and EOM are normal. Pupils are equal, round, and reactive to light.  Neck: Normal range of motion. Neck supple.  Cardiovascular: Normal rate and regular rhythm.   Pulmonary/Chest: Effort normal and breath sounds normal.  Abdominal: Soft. Bowel sounds are normal.  Musculoskeletal:       Right hip: She exhibits decreased range of motion, decreased strength, tenderness and bony tenderness.       Right knee: She exhibits no swelling. tenderness found.       Left knee: She exhibits no swelling. tenderness found.  Neurological: She is alert.  Psychiatric: Her speech is normal and behavior is normal. Thought content normal. Her affect is blunt. Cognition and memory are impaired. She exhibits abnormal recent memory.          Assessment & Plan:  ASSESSMENT:  1. History of traumatic brain injury.  2. Bilateral knee pain with patellar tendinitis, likely some  quadriceps tendinitis, which has improved greatly with injections 3. Centralized pain syndrome.  4. Chronic cervicalgia.  5. Right greater trochanter bursitis  PLAN:  1. Continue strengthing exercises for lower extremities. Continue ice and voltaren gel 2. Patient was provided info and exercises for her greater trochanter.  Stretching  is important here as well.  2. I will see her back in about 3 months. They will call me with any  problems or questions in the interim.

## 2011-08-03 ENCOUNTER — Encounter: Payer: Self-pay | Admitting: Physical Medicine & Rehabilitation

## 2011-09-06 ENCOUNTER — Other Ambulatory Visit: Payer: Self-pay

## 2011-09-06 MED ORDER — NAPROXEN 500 MG PO TABS: 500.0000 mg | ORAL_TABLET | Freq: Two times a day (BID) | ORAL | Status: DC

## 2011-09-08 ENCOUNTER — Other Ambulatory Visit: Payer: Self-pay | Admitting: *Deleted

## 2011-09-08 MED ORDER — NAPROXEN 500 MG PO TABS: 500.0000 mg | ORAL_TABLET | Freq: Two times a day (BID) | ORAL | Status: DC

## 2011-11-01 ENCOUNTER — Encounter: Payer: Medicare Other | Attending: Physical Medicine & Rehabilitation | Admitting: Physical Medicine & Rehabilitation

## 2011-11-01 ENCOUNTER — Encounter: Payer: Self-pay | Admitting: Physical Medicine & Rehabilitation

## 2011-11-01 VITALS — BP 143/86 | HR 91 | Resp 14 | Ht <= 58 in | Wt 129.0 lb

## 2011-11-01 DIAGNOSIS — IMO0002 Reserved for concepts with insufficient information to code with codable children: Secondary | ICD-10-CM

## 2011-11-01 DIAGNOSIS — M1710 Unilateral primary osteoarthritis, unspecified knee: Secondary | ICD-10-CM

## 2011-11-01 DIAGNOSIS — M7060 Trochanteric bursitis, unspecified hip: Secondary | ICD-10-CM

## 2011-11-01 DIAGNOSIS — M76899 Other specified enthesopathies of unspecified lower limb, excluding foot: Secondary | ICD-10-CM

## 2011-11-01 DIAGNOSIS — S069XAA Unspecified intracranial injury with loss of consciousness status unknown, initial encounter: Secondary | ICD-10-CM

## 2011-11-01 DIAGNOSIS — F329 Major depressive disorder, single episode, unspecified: Secondary | ICD-10-CM | POA: Insufficient documentation

## 2011-11-01 DIAGNOSIS — F3289 Other specified depressive episodes: Secondary | ICD-10-CM | POA: Insufficient documentation

## 2011-11-01 DIAGNOSIS — M25569 Pain in unspecified knee: Secondary | ICD-10-CM | POA: Insufficient documentation

## 2011-11-01 DIAGNOSIS — G890 Central pain syndrome: Secondary | ICD-10-CM | POA: Insufficient documentation

## 2011-11-01 DIAGNOSIS — S069XAS Unspecified intracranial injury with loss of consciousness status unknown, sequela: Secondary | ICD-10-CM | POA: Insufficient documentation

## 2011-11-01 DIAGNOSIS — M7650 Patellar tendinitis, unspecified knee: Secondary | ICD-10-CM

## 2011-11-01 DIAGNOSIS — F17200 Nicotine dependence, unspecified, uncomplicated: Secondary | ICD-10-CM | POA: Insufficient documentation

## 2011-11-01 DIAGNOSIS — X58XXXS Exposure to other specified factors, sequela: Secondary | ICD-10-CM | POA: Insufficient documentation

## 2011-11-01 DIAGNOSIS — G8929 Other chronic pain: Secondary | ICD-10-CM

## 2011-11-01 DIAGNOSIS — S069X9A Unspecified intracranial injury with loss of consciousness of unspecified duration, initial encounter: Secondary | ICD-10-CM

## 2011-11-01 DIAGNOSIS — S069X9S Unspecified intracranial injury with loss of consciousness of unspecified duration, sequela: Secondary | ICD-10-CM | POA: Insufficient documentation

## 2011-11-01 NOTE — Progress Notes (Deleted)
  Subjective:    Patient ID: Julie Simpson, female    DOB: 06-11-1965, 46 y.o.   MRN: [id number]  HPI Pain Inventory Average Pain {NUMBERS; 0-10:5044} Pain Right Now {NUMBERS; 0-10:5044} My pain is {PAIN DESCRIPTION:21022940}  In the last 24 hours, has pain interfered with the following? General activity {NUMBERS; 0-10:5044} Relation with others {NUMBERS; 0-10:5044} Enjoyment of life {NUMBERS; 0-10:5044} What TIME of day is your pain at its worst? {TIME OF WUJ:81191478} Sleep (in general) {BHH GOOD/FAIR/POOR:22877}  Pain is worse with: {ACTIVITIES:21022942} Pain improves with: {PAIN IMPROVES GNFA:21308657} Relief from Meds: {NUMBERS; 0-10:5044}  Mobility {MOBILITY QIO:96295284}  Function {FUNCTION:21022946}  Neuro/Psych {NEURO/PSYCH:21022948}  Prior Studies {CPRM PRIOR STUDIES:21022953}  Physicians involved in your care {CPRM PHYSICIANS INVOLVED IN YOUR CARE:21022954}   Family History  Problem Relation Age of Onset  . Cancer Mother 103    brain tumor  . Diabetes Father    History   Social History  . Marital Status: Married    Spouse Name: N/A    Number of Children: N/A  . Years of Education: N/A   Social History Main Topics  . Smoking status: Current Everyday Smoker -- 1.0 packs/day  . Smokeless tobacco: Never Used  . Alcohol Use: None  . Drug Use: None  . Sexually Active: None   Other Topics Concern  . None   Social History Narrative  . None   Past Surgical History  Procedure Date  . Hernia repair   . Colon surgery     polypectomy  . Tubal ligation    Past Medical History  Diagnosis Date  . 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 Personality change due to conditions classified elsewhere   . Calcifying tendinitis of shoulder   . Bicipital tenosynovitis   . Cervical spondylosis without myelopathy    There were no vitals taken for this  visit.      Review of Systems     Objective:   Physical Exam  Constitutional: She appears well-developed and well-nourished.  HENT:  Head: Normocephalic and atraumatic.  Eyes: Conjunctivae and EOM are normal. Pupils are equal, round, and reactive to light.  Neck: Normal range of motion. Neck supple.  Cardiovascular: Normal rate and regular rhythm.  Pulmonary/Chest: Effort normal and breath sounds normal.  Abdominal: Soft. Bowel sounds are normal.  Musculoskeletal:  Right hip: She exhibits decreased range of motion, decreased strength, tenderness and bony tenderness.  Right knee: She exhibits no swelling. tenderness found.  Left knee: She exhibits no swelling. tenderness found.  Neurological: She is alert.  Psychiatric: Her speech is normal and behavior is normal. Thought content normal. Her affect is blunt. Cognition and memory are impaired. She exhibits abnormal recent memory.  Assessment & Plan:   ASSESSMENT:  1. History of traumatic brain injury.  2. Bilateral knee pain with patellar tendinitis, likely some  quadriceps tendinitis, which has improved greatly with injections  3. Centralized pain syndrome.  4. Chronic cervicalgia.  5. Right greater trochanter bursitis  PLAN:  1. Continue strengthing exercises for lower extremities. Continue ice and voltaren gel  2. Patient was provided info and exercises for her greater trochanter. Stretching is important here as well.  2. I will see her back in about 3 months. They will call me with any  problems or questions in the interim.       Assessment & Plan:

## 2011-11-01 NOTE — Patient Instructions (Signed)
YOU NEED TO CONTINUE WITH YOUR RANGE OF MOTION AND STRENGTHENING EXERCISES FOR YOUR KNEES.   NEOPRENE KNEE SLEEVE WOULD BE HELPFUL   GLUCOSAMINE AND CHRONDROITIN FOR KNEE PAIN. TAKE AS DIRECTED.

## 2011-11-01 NOTE — Progress Notes (Signed)
Subjective:    Patient ID: Julie Simpson, female    DOB: Apr 21, 1966, 46 y.o.   MRN: [id number]  HPI  Alyssamae is back regarding her chronic pain, BI. She has been trying to exercise more. She uses a quad cane for balance. He knees remain a source of pain. She is limited by her cognitive abilities and safety still. She continues on naproxen for pain relief.  No other health problems reported  Pain Inventory Average Pain 8 Pain Right Now 8 My pain is sharp, stabbing and aching  In the last 24 hours, has pain interfered with the following? General activity 4 Relation with others 6 Enjoyment of life 4 What TIME of day is your pain at its worst? all the time Sleep (in general) Fair  Pain is worse with: bending, sitting, inactivity, standing and some activites Pain improves with: rest and pacing activities Relief from Meds: 5  Mobility use a cane ability to climb steps?  yes do you drive?  no needs help with transfers  Function disabled: date disabled  Do you have any goals in this area?  yes  Neuro/Psych weakness trouble walking confusion depression  Prior Studies Any changes since last visit?  no  Physicians involved in your care Any changes since last visit?  no   Family History  Problem Relation Age of Onset  . Cancer Mother 69    brain tumor  . Diabetes Father    History   Social History  . Marital Status: Married    Spouse Name: N/A    Number of Children: N/A  . Years of Education: N/A   Social History Main Topics  . Smoking status: Current Everyday Smoker -- 1.0 packs/day  . Smokeless tobacco: Never Used  . Alcohol Use: None  . Drug Use: None  . Sexually Active: None   Other Topics Concern  . None   Social History Narrative  . None   Past Surgical History  Procedure Date  . Hernia repair   . Colon surgery     polypectomy  . Tubal ligation    Past Medical History  Diagnosis Date  . Intracranial injury of other and unspecified nature,  without mention of open intracranial wound, unspecified state of consciousness   . Personality change due to conditions classified elsewhere   . Calcifying tendinitis of shoulder   . Bicipital tenosynovitis   . Cervical spondylosis without myelopathy    BP 143/86  Pulse 91  Resp 14  Ht 4\' 10"  (1.473 m)  Wt 129 lb (58.514 kg)  BMI 26.96 kg/m2  SpO2 96%     Review of Systems  Constitutional: Positive for diaphoresis.  Respiratory: Positive for apnea.   Musculoskeletal: Positive for myalgias, arthralgias and gait problem.  Neurological: Positive for weakness.  Psychiatric/Behavioral: Positive for confusion and dysphoric mood.  All other systems reviewed and are negative.       Objective:   Physical Exam          Constitutional: She appears well-developed and well-nourished.  HENT:  Head: Normocephalic and atraumatic.  Eyes: Conjunctivae and EOM are normal. Pupils are equal, round, and reactive to light.  Neck: Normal range of motion. Neck supple.  Cardiovascular: Normal rate and regular rhythm.  Pulmonary/Chest: Effort normal and breath sounds normal.  Abdominal: Soft. Bowel sounds are normal.  Musculoskeletal:  Right hip: She exhibits decreased range of motion, decreased strength, tenderness and bony tenderness. Right knee: She exhibits no swelling. tenderness found along joint line. Minimal crepitus  Left knee: She exhibits no swelling. tenderness found along joint line. minmal crepitus Neurological: She is alert.  Psychiatric: Her speech is normal and behavior is normal. Thought content normal. Her affect is blunt. Cognition and memory are impaired. She exhibits abnormal recent memory.    Assessment & Plan:   ASSESSMENT:  1. History of traumatic brain injury.  2. Bilateral knee pain with patellar tendinitis, and likely small meniscal injuries 3. Centralized pain syndrome.  4. Chronic cervicalgia.  5. Right greater trochanter bursitis  PLAN:  1. Continue  strengthing exercises for lower extremities. Continue ice and voltaren gel. Her quads remain weak as are her other leg muscles. Discused glucosamine and chrondoitin for knees as well. 2. We discussed an MRI, but i'm not sure how it will change her management at this point. I did recommend a neoprene knee sleeve as well.  2. I will see her back in about 3 months. They will call me with any  problems or questions in the interim.

## 2012-02-26 ENCOUNTER — Encounter: Payer: Self-pay | Admitting: Physical Medicine & Rehabilitation

## 2012-02-26 ENCOUNTER — Encounter: Payer: Medicare Other | Attending: Physical Medicine & Rehabilitation | Admitting: Physical Medicine & Rehabilitation

## 2012-02-26 ENCOUNTER — Other Ambulatory Visit: Payer: Self-pay | Admitting: Physical Medicine & Rehabilitation

## 2012-02-26 VITALS — BP 140/83 | HR 73 | Resp 14 | Ht <= 58 in | Wt 126.0 lb

## 2012-02-26 DIAGNOSIS — R4189 Other symptoms and signs involving cognitive functions and awareness: Secondary | ICD-10-CM | POA: Insufficient documentation

## 2012-02-26 DIAGNOSIS — S069XAS Unspecified intracranial injury with loss of consciousness status unknown, sequela: Secondary | ICD-10-CM | POA: Insufficient documentation

## 2012-02-26 DIAGNOSIS — M542 Cervicalgia: Secondary | ICD-10-CM | POA: Insufficient documentation

## 2012-02-26 DIAGNOSIS — S069X9A Unspecified intracranial injury with loss of consciousness of unspecified duration, initial encounter: Secondary | ICD-10-CM

## 2012-02-26 DIAGNOSIS — M1710 Unilateral primary osteoarthritis, unspecified knee: Secondary | ICD-10-CM

## 2012-02-26 DIAGNOSIS — E039 Hypothyroidism, unspecified: Secondary | ICD-10-CM

## 2012-02-26 DIAGNOSIS — M25569 Pain in unspecified knee: Secondary | ICD-10-CM | POA: Insufficient documentation

## 2012-02-26 DIAGNOSIS — M7650 Patellar tendinitis, unspecified knee: Secondary | ICD-10-CM | POA: Insufficient documentation

## 2012-02-26 DIAGNOSIS — S069X9S Unspecified intracranial injury with loss of consciousness of unspecified duration, sequela: Secondary | ICD-10-CM | POA: Insufficient documentation

## 2012-02-26 DIAGNOSIS — X58XXXS Exposure to other specified factors, sequela: Secondary | ICD-10-CM | POA: Insufficient documentation

## 2012-02-26 DIAGNOSIS — G8929 Other chronic pain: Secondary | ICD-10-CM | POA: Insufficient documentation

## 2012-02-26 DIAGNOSIS — M76899 Other specified enthesopathies of unspecified lower limb, excluding foot: Secondary | ICD-10-CM | POA: Insufficient documentation

## 2012-02-26 DIAGNOSIS — G890 Central pain syndrome: Secondary | ICD-10-CM | POA: Insufficient documentation

## 2012-02-26 LAB — COMPREHENSIVE METABOLIC PANEL
AST: 16 U/L (ref 0–37)
Albumin: 4.3 g/dL (ref 3.5–5.2)
CO2: 25 mEq/L (ref 19–32)
Calcium: 9.3 mg/dL (ref 8.4–10.5)
Creat: 0.64 mg/dL (ref 0.50–1.10)
Potassium: 4.4 mEq/L (ref 3.5–5.3)
Total Bilirubin: 0.3 mg/dL (ref 0.3–1.2)
Total Protein: 6.5 g/dL (ref 6.0–8.3)

## 2012-02-26 LAB — TESTOSTERONE: Testosterone: 43.87 ng/dL (ref 10–70)

## 2012-02-26 LAB — CBC
HCT: 46.7 % — ABNORMAL HIGH (ref 36.0–46.0)
Hemoglobin: 16.4 g/dL — ABNORMAL HIGH (ref 12.0–15.0)
MCH: 32.8 pg (ref 26.0–34.0)
MCHC: 35.1 g/dL (ref 30.0–36.0)
MCV: 93.4 fL (ref 78.0–100.0)
RDW: 13.3 % (ref 11.5–15.5)

## 2012-02-26 LAB — T4: T4, Total: 7.2 ug/dL (ref 5.0–12.5)

## 2012-02-26 MED ORDER — METHYLPHENIDATE HCL ER 10 MG PO TBCR: 10.0000 mg | EXTENDED_RELEASE_TABLET | ORAL | Status: DC

## 2012-02-26 NOTE — Progress Notes (Deleted)
  Subjective:    Patient ID: Julie Simpson, female    DOB: 09/21/1965, 45 y.o.   MRN: [id number]  HPI    Review of Systems     Objective:   Physical Exam        Assessment & Plan:

## 2012-02-26 NOTE — Patient Instructions (Signed)
YOU NEED TO KEEP A MEMORY BOOK AND USE IT EACH AND EVERY DAY!!!!!!!!!

## 2012-02-26 NOTE — Progress Notes (Signed)
Subjective:    Patient ID: Julie Simpson, female    DOB: [birth year], 47 y.o.   MRN: [id number]  HPI  Julie Simpson is back regarding her TBI. For the most part things have been about the same. She is able to function within the house on a limited level. She continues to have poor memory and attention. She only exercises really when her husband gets her out. She has chronic pain at her knees, back and neck.    Pain Inventory Average Pain 8 Pain Right Now 8 My pain is sharp, stabbing and aching  In the last 24 hours, has pain interfered with the following? General activity 5 Relation with others 6 Enjoyment of life 3 What TIME of day is your pain at its worst? all the time Sleep (in general) Fair  Pain is worse with: walking and some activites Pain improves with: medication Relief from Meds: 5  Mobility use a cane how many minutes can you walk? 5 ability to climb steps?  yes do you drive?  no needs help with transfers  Function disabled: date disabled  I need assistance with the following:  meal prep and household duties  Neuro/Psych trouble walking spasms confusion depression anxiety  Prior Studies Any changes since last visit?  no  Physicians involved in your care Any changes since last visit?  no   Family History  Problem Relation Age of Onset  . Cancer Mother 22    brain tumor  . Diabetes Father    History   Social History  . Marital Status: Married    Spouse Name: N/A    Number of Children: N/A  . Years of Education: N/A   Social History Main Topics  . Smoking status: Current Every Day Smoker -- 1.0 packs/day  . Smokeless tobacco: Never Used  . Alcohol Use: None  . Drug Use: None  . Sexually Active: None   Other Topics Concern  . None   Social History Narrative  . None   Past Surgical History  Procedure Date  . Hernia repair   . Colon surgery     polypectomy  . Tubal ligation    Past Medical History  Diagnosis Date  . Intracranial  injury of other and unspecified nature, without mention of open intracranial wound, unspecified state of consciousness   . Personality change due to conditions classified elsewhere   . Calcifying tendinitis of shoulder   . Bicipital tenosynovitis   . Cervical spondylosis without myelopathy    BP 140/83  Pulse 73  Resp 14  Ht 4\' 10"  (1.473 m)  Wt 126 lb (57.153 kg)  BMI 26.33 kg/m2  SpO2 95%     Review of Systems  Gastrointestinal: Positive for constipation.  Musculoskeletal: Positive for myalgias, arthralgias and gait problem.  Psychiatric/Behavioral: Positive for confusion and dysphoric mood. The patient is nervous/anxious.   All other systems reviewed and are negative.       Objective:   Physical Exam  HENT:  Head: Normocephalic and atraumatic.  Eyes: Conjunctivae and EOM are normal. Pupils are equal, round, and reactive to light.  Neck: Normal range of motion. Neck supple.  Cardiovascular: Normal rate and regular rhythm.  Pulmonary/Chest: Effort normal and breath sounds normal.  Abdominal: Soft. Bowel sounds are normal.  Musculoskeletal:  Right hip: She exhibits decreased range of motion, decreased strength, tenderness and bony tenderness. Right knee: She exhibits no swelling. tenderness found along joint line. Minimal crepitus Left knee: She exhibits no swelling. tenderness found along joint  line. minmal crepitus Neurological: She is alert.  Psychiatric: Her speech is normal and behavior is normal. Thought content normal. Her affect is blunt. Cognition and memory are impaired. She exhibits abnormal recent memory.   Assessment & Plan:   ASSESSMENT:  1. 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 2. Bilateral knee pain with patellar tendinitis, and likely small meniscal injuries  3. Centralized pain syndrome.  4. Chronic cervicalgia.  5. Right greater trochanter bursitis   PLAN:  1. Continue strengthing exercises for lower  extremities. Continue ice and voltaren gel. Her quads remain weak as are her other leg muscles. Discused glucosamine and chrondoitin for knees as well.  2. We ordered multiple labs including TSH, T4, Vitamin D, Cortisol, Growth Hormone, Testosterone, estrogen, CMET, CBC to see if we can find something which may give her a boost with energy and memory. A lot of her adjustment will need to come from compensation. .  2. I will see her back in about 3 months. They will call me with any  problems or questions in the interim

## 2012-02-29 ENCOUNTER — Telehealth: Payer: Self-pay

## 2012-02-29 NOTE — Telephone Encounter (Signed)
Grow hormone request was not able to be processed.  Informed patients husband of the problem and he is going to call next week with a place they can go to have this done.  We will fax an order.

## 2012-03-04 ENCOUNTER — Ambulatory Visit: Payer: Medicare Other | Admitting: Physical Medicine & Rehabilitation

## 2012-03-07 LAB — ESTROGENS, TOTAL

## 2012-05-01 ENCOUNTER — Telehealth: Payer: Self-pay | Admitting: *Deleted

## 2012-05-01 NOTE — Telephone Encounter (Signed)
Patient said Dr. Tretha Sciara wanted her PCP to have blood work drawn during her next OV. Patient is not sure what needs to be drawn. Needs orders faxed to PCP office. F# [phone]. Dr. Arlyce Dice.

## 2012-05-01 NOTE — Telephone Encounter (Signed)
Faxed 05/01/12  Request for growth hormone and estrogen to Dr Arlyce Dice.

## 2012-05-03 ENCOUNTER — Telehealth: Payer: Self-pay

## 2012-05-03 NOTE — Telephone Encounter (Signed)
Patient called regarding blood work order to be sent to [facility].  Patient aware order has been sent to office.

## 2012-05-08 ENCOUNTER — Telehealth: Payer: Self-pay

## 2012-05-08 NOTE — Telephone Encounter (Signed)
Patient called to find out if lab orders had been faxed to [facility] [phone].   Advised her this had been taken care of and she can follow up with her primary care.  She understands.

## 2012-05-13 ENCOUNTER — Telehealth: Payer: Self-pay | Admitting: *Deleted

## 2012-05-13 NOTE — Telephone Encounter (Signed)
Patient called to find out if lab orders had been faxed to [facility] [phone]. Notified patient that orders had been faxed several times. Advised her we would fax again but if they did not receive them she would have to come to the office and pick up the orders.

## 2012-05-28 ENCOUNTER — Encounter: Payer: Medicare Other | Attending: Physical Medicine & Rehabilitation | Admitting: Physical Medicine & Rehabilitation

## 2012-05-28 ENCOUNTER — Encounter: Payer: Self-pay | Admitting: Physical Medicine & Rehabilitation

## 2012-05-28 VITALS — BP 165/100 | HR 74 | Resp 14 | Ht <= 58 in | Wt 130.0 lb

## 2012-05-28 DIAGNOSIS — X58XXXS Exposure to other specified factors, sequela: Secondary | ICD-10-CM | POA: Insufficient documentation

## 2012-05-28 DIAGNOSIS — M179 Osteoarthritis of knee, unspecified: Secondary | ICD-10-CM

## 2012-05-28 DIAGNOSIS — F3289 Other specified depressive episodes: Secondary | ICD-10-CM

## 2012-05-28 DIAGNOSIS — M1710 Unilateral primary osteoarthritis, unspecified knee: Secondary | ICD-10-CM

## 2012-05-28 DIAGNOSIS — IMO0002 Reserved for concepts with insufficient information to code with codable children: Secondary | ICD-10-CM

## 2012-05-28 DIAGNOSIS — S069X9A Unspecified intracranial injury with loss of consciousness of unspecified duration, initial encounter: Secondary | ICD-10-CM

## 2012-05-28 DIAGNOSIS — F329 Major depressive disorder, single episode, unspecified: Secondary | ICD-10-CM | POA: Insufficient documentation

## 2012-05-28 DIAGNOSIS — S069XAA Unspecified intracranial injury with loss of consciousness status unknown, initial encounter: Secondary | ICD-10-CM

## 2012-05-28 DIAGNOSIS — F32A Depression, unspecified: Secondary | ICD-10-CM

## 2012-05-28 MED ORDER — CITALOPRAM HYDROBROMIDE 20 MG PO TABS: 20.0000 mg | ORAL_TABLET | Freq: Every day | ORAL | Status: DC

## 2012-05-28 MED ORDER — METHYLPHENIDATE HCL ER (LA) 20 MG PO CP24: 20.0000 mg | ORAL_CAPSULE | ORAL | Status: DC

## 2012-05-28 NOTE — Patient Instructions (Signed)
YOU MAY TRY A MULTIVITAMIN WITH B COMPLEX

## 2012-05-28 NOTE — Progress Notes (Signed)
Subjective:    Patient ID: Julie Simpson, female    DOB: 02-22-1966, 47 y.o.   MRN: [id number]  HPI  Julie Simpson is back regarding her TBI and chronic pain. She has been more engaged overall. She has been more active at home. She reports depression has been ongoing. The depression has been worse since the holidays due to some family problems.   The labwork we ordered in November was essentially within normal limits.  She remains on the methylphenidate 10mg   ER daily. Her husband feels that it's helped somewhat with her energy levels.   Pain levels have remained fairly consistent.  Pain Inventory Average Pain 7 Pain Right Now 8 My pain is sharp, stabbing and aching  In the last 24 hours, has pain interfered with the following? General activity 6 Relation with others 5 Enjoyment of life 4 What TIME of day is your pain at its worst? all the time Sleep (in general) Fair  Pain is worse with: walking, standing and some activites Pain improves with: medication Relief from Meds: 5  Mobility use a cane do you drive?  no needs help with transfers Do you have any goals in this area?  yes  Function not employed: date last employed 2008 disabled: date disabled 2008 I need assistance with the following:  meal prep and household duties  Neuro/Psych trouble walking spasms depression  Prior Studies Any changes since last visit?  no  Physicians involved in your care Any changes since last visit?  no   Family History  Problem Relation Age of Onset  . Cancer Mother 55    brain tumor  . Diabetes Father    History   Social History  . Marital Status: Married    Spouse Name: N/A    Number of Children: N/A  . Years of Education: N/A   Social History Main Topics  . Smoking status: Current Every Day Smoker -- 1.0 packs/day  . Smokeless tobacco: Never Used  . Alcohol Use: None  . Drug Use: None  . Sexually Active: None   Other Topics Concern  . None   Social History  Narrative  . None   Past Surgical History  Procedure Date  . Hernia repair   . Colon surgery     polypectomy  . Tubal ligation    Past Medical History  Diagnosis Date  . 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 Personality change due to conditions classified elsewhere   . Calcifying tendinitis of shoulder   . Bicipital tenosynovitis   . Cervical spondylosis without myelopathy    BP 165/100  Pulse 74  Resp 14  Ht 4\' 10"  (1.473 m)  Wt 130 lb (58.968 kg)  BMI 27.17 kg/m2  SpO2 96%     Review of Systems  Musculoskeletal: Positive for gait problem.  Psychiatric/Behavioral: Positive for dysphoric mood.  All other systems reviewed and are negative.       Objective:   Physical Exam HENT:  Head: Normocephalic and atraumatic.  Eyes: Conjunctivae and EOM are normal. Pupils are equal, round, and reactive to light.  Neck: Normal range of motion. Neck supple.  Cardiovascular: Normal rate and regular rhythm.  Pulmonary/Chest: Effort normal and breath sounds normal.  Abdominal: Soft. Bowel sounds are normal.  Musculoskeletal:  Right hip: She exhibits decreased range of motion, decreased strength, tenderness and bony tenderness. Right knee: She exhibits no swelling. tenderness found along joint line. Minimal  crepitus Left knee: She exhibits no swelling. tenderness found along joint line. minmal crepitus Neurological: She is alert.  Psychiatric: Her speech is normal and behavior is normal. Thought content normal. Her affect is blunt. Cognition and memory are impaired. She exhibits abnormal recent memory. She tended to initiate a bit more today.   Assessment & Plan:   ASSESSMENT:  1. 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  2. Bilateral knee pain with patellar tendinitis, and likely small meniscal injuries  3. Centralized pain syndrome.  4.  Chronic cervicalgia.  5. Right greater trochanter bursitis   PLAN:  1. Continue strengthing exercises for lower extremities. Stressed creating a routine on a daily basis. It sounds as if she is doing a bit more. She will need to make a long term commitment to pushing herself however. 2. She may try a multivitamin with B complex.   3. Begin a trial of celexa 20mg  qhs to assist with mood and perhaps with sleep. 4. Increase the methyphenidate to 20mg  ER. 5.  I will see her back in about 3 months. They will call me with any  problems or questions in the interim

## 2012-09-24 ENCOUNTER — Encounter: Payer: Self-pay | Admitting: Physical Medicine & Rehabilitation

## 2012-09-24 ENCOUNTER — Encounter: Payer: Medicare Other | Attending: Physical Medicine & Rehabilitation | Admitting: Physical Medicine & Rehabilitation

## 2012-09-24 VITALS — BP 125/83 | HR 66 | Resp 14 | Ht <= 58 in | Wt 126.0 lb

## 2012-09-24 DIAGNOSIS — I69919 Unspecified symptoms and signs involving cognitive functions following unspecified cerebrovascular disease: Secondary | ICD-10-CM | POA: Insufficient documentation

## 2012-09-24 DIAGNOSIS — Z5189 Encounter for other specified aftercare: Secondary | ICD-10-CM

## 2012-09-24 DIAGNOSIS — M542 Cervicalgia: Secondary | ICD-10-CM | POA: Insufficient documentation

## 2012-09-24 DIAGNOSIS — M1710 Unilateral primary osteoarthritis, unspecified knee: Secondary | ICD-10-CM

## 2012-09-24 DIAGNOSIS — M76899 Other specified enthesopathies of unspecified lower limb, excluding foot: Secondary | ICD-10-CM | POA: Insufficient documentation

## 2012-09-24 DIAGNOSIS — F329 Major depressive disorder, single episode, unspecified: Secondary | ICD-10-CM

## 2012-09-24 DIAGNOSIS — M7650 Patellar tendinitis, unspecified knee: Secondary | ICD-10-CM | POA: Insufficient documentation

## 2012-09-24 DIAGNOSIS — F17200 Nicotine dependence, unspecified, uncomplicated: Secondary | ICD-10-CM | POA: Insufficient documentation

## 2012-09-24 DIAGNOSIS — G8929 Other chronic pain: Secondary | ICD-10-CM | POA: Insufficient documentation

## 2012-09-24 DIAGNOSIS — M25569 Pain in unspecified knee: Secondary | ICD-10-CM | POA: Insufficient documentation

## 2012-09-24 DIAGNOSIS — M25519 Pain in unspecified shoulder: Secondary | ICD-10-CM | POA: Insufficient documentation

## 2012-09-24 DIAGNOSIS — E049 Nontoxic goiter, unspecified: Secondary | ICD-10-CM | POA: Insufficient documentation

## 2012-09-24 DIAGNOSIS — G890 Central pain syndrome: Secondary | ICD-10-CM | POA: Insufficient documentation

## 2012-09-24 DIAGNOSIS — S069X0D Unspecified intracranial injury without loss of consciousness, subsequent encounter: Secondary | ICD-10-CM

## 2012-09-24 NOTE — Patient Instructions (Addendum)
PLEASE LET YOUR FAMILY DOCTOR KNOW THAT I ORDERED AN ULTRASOUND OF YOUR NECK.  FOLLOW UP WITH YOUR FAMILY DOCTOR REGARDING LAB WORK.  I DON'T WANT YOU WEARING A BACK BRACE. TRY WEARING A SMALL BOOK BAG WITH 5LBS OF WEIGHT TO ENCOURAGE EXTENSION OF YOUR UPPER BACK AND NECK.

## 2012-09-24 NOTE — Progress Notes (Signed)
Subjective:    Patient ID: Julie Simpson, female    DOB: [birth year], 47 y.o.   MRN: [id number]  HPI  Julie Simpson is back regarding her chronic pain and TBI. Overall , there hasn't been a lot of change. She is trying to exercise. She still has problems with her balance and fatigues easily.   We did thyroid testing at her last visit which only remarkable for decreased TSH although her T4 level was normal. i went back and looked at a cervical MRI which showed some nodules on the thyroid. She has noticed some "swelling" over her neck.   She maintains on ritalin for energy and attention. She continues with STM deficits.   She continues with neck and shoulder pain particularly on the left. Her husband reminds her frequently for posture. She tries heat and ice as well. She continues on hydrocodone for pain control.   The celexa seems to have helped her mood and energy levels.      Pain Inventory Average Pain 8 Pain Right Now 8 My pain is sharp, burning, stabbing and aching  In the last 24 hours, has pain interfered with the following? General activity 6 Relation with others 4 Enjoyment of life 4 What TIME of day is your pain at its worst? constant Sleep (in general) Fair  Pain is worse with: some activites Pain improves with: pacing activities Relief from Meds: n/a  Mobility use a cane ability to climb steps?  yes needs help with transfers Do you have any goals in this area?  yes  Function disabled: date disabled . I need assistance with the following:  meal prep, household duties and shopping  Neuro/Psych bowel control problems trouble walking confusion depression  Prior Studies Any changes since last visit?  no  Physicians involved in your care Any changes since last visit?  no   Family History  Problem Relation Age of Onset  . Cancer Mother 44    brain tumor  . Diabetes Father    History   Social History  . Marital Status: Married    Spouse Name: N/A   Number of Children: N/A  . Years of Education: N/A   Social History Main Topics  . Smoking status: Current Every Day Smoker -- 1.00 packs/day  . Smokeless tobacco: Never Used  . Alcohol Use: None  . Drug Use: None  . Sexually Active: None   Other Topics Concern  . None   Social History Narrative  . None   Past Surgical History  Procedure Laterality Date  . Hernia repair    . Colon surgery      polypectomy  . Tubal ligation     Past Medical History  Diagnosis Date  . 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 Personality change due to conditions classified elsewhere   . Calcifying tendinitis of shoulder   . Bicipital tenosynovitis   . Cervical spondylosis without myelopathy    BP 125/83  Pulse 66  Resp 14  Ht 4\' 10"  (1.473 m)  Wt 126 lb (57.153 kg)  BMI 26.34 kg/m2  SpO2 95%     Review of Systems  Gastrointestinal: Positive for abdominal pain.  Musculoskeletal: Positive for myalgias, arthralgias and gait problem.  Hematological: Bruises/bleeds easily.  Psychiatric/Behavioral: Positive for confusion and dysphoric mood.  All other systems reviewed and are negative.       Objective:   Physical Exam  HENT:  Head: Normocephalic  and atraumatic.  Eyes: Conjunctivae and EOM are normal. Pupils are equal, round, and reactive to light.  Neck: Normal range of motion. Neck supple.  Cardiovascular: Normal rate and regular rhythm.  Pulmonary/Chest: Effort normal and breath sounds normal.  Abdominal: Soft. Bowel sounds are normal.  Musculoskeletal:  Right hip: She exhibits decreased range of motion, decreased strength, tenderness and bony tenderness. She has a head forward posture and pain with palpation over the left greater than right trap into the base of the neck.  Right knee: She exhibits no swelling. tenderness found along joint line. Minimal crepitus Left knee: She  exhibits no swelling. tenderness found along joint line. minmal crepitus Neurological: She is alert. She walks with wide based gait, her left knee tends to fall into a varus position. Psychiatric: Her speech is normal and behavior is normal. Thought content normal. Her affect is blunt. Cognition and memory are impaired. She exhibits abnormal recent memory. She tended to initiate a bit more today.    Assessment & Plan:   ASSESSMENT:  1. 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  2. Bilateral knee pain with patellar tendinitis, and likely small meniscal injuries  3. Centralized pain syndrome.  4. Chronic cervicalgia.  5. Right greater trochanter bursitis    PLAN:  1. Given her neck pain and other symptoms, recent lab results and prior imaging, i think it would be worthwhile to obtain an U/S of the neck to evaluate her goiter. She will see her PCP this week regarding lab work. 2. Reviewed with her the routine of wearing a small book bag with 5lb weight to remind her about posture. She should not be wearing a brace.  3. Continue celexa 20mg  qhs to assist with mood and perhaps with sleep.  4. continue the methyphenidate to 20mg  ER.  5. I will see her back in about 3 months. They will call me with any  problems or questions in the interim

## 2012-10-07 ENCOUNTER — Telehealth: Payer: Self-pay

## 2012-10-07 NOTE — Telephone Encounter (Signed)
Patient called regarding lab results.  Advised her we do not have them.  She will get them faxed to [place].

## 2012-10-14 ENCOUNTER — Telehealth: Payer: Self-pay | Admitting: *Deleted

## 2012-10-14 NOTE — Telephone Encounter (Signed)
Wants to know the results on the scan on her neck.  This was done at [facility] in [place]. Julie Simpson can you get the results faxed to [place]?

## 2012-10-23 ENCOUNTER — Telehealth: Payer: Self-pay

## 2012-10-23 NOTE — Telephone Encounter (Signed)
Patient called to follow up on lab work.  Advised her to contact [facility] to follow up on these.

## 2012-12-25 ENCOUNTER — Encounter: Payer: Medicare Other | Attending: Physical Medicine & Rehabilitation | Admitting: Physical Medicine & Rehabilitation

## 2012-12-25 ENCOUNTER — Encounter: Payer: Self-pay | Admitting: Physical Medicine & Rehabilitation

## 2012-12-25 VITALS — BP 133/81 | HR 66 | Resp 14 | Ht 59.0 in | Wt 122.0 lb

## 2012-12-25 DIAGNOSIS — G8929 Other chronic pain: Secondary | ICD-10-CM | POA: Insufficient documentation

## 2012-12-25 DIAGNOSIS — S069X9S Unspecified intracranial injury with loss of consciousness of unspecified duration, sequela: Secondary | ICD-10-CM

## 2012-12-25 DIAGNOSIS — M76899 Other specified enthesopathies of unspecified lower limb, excluding foot: Secondary | ICD-10-CM | POA: Insufficient documentation

## 2012-12-25 DIAGNOSIS — G3184 Mild cognitive impairment, so stated: Secondary | ICD-10-CM | POA: Insufficient documentation

## 2012-12-25 DIAGNOSIS — F329 Major depressive disorder, single episode, unspecified: Secondary | ICD-10-CM

## 2012-12-25 DIAGNOSIS — G890 Central pain syndrome: Secondary | ICD-10-CM | POA: Insufficient documentation

## 2012-12-25 DIAGNOSIS — X58XXXS Exposure to other specified factors, sequela: Secondary | ICD-10-CM | POA: Insufficient documentation

## 2012-12-25 DIAGNOSIS — S069XAS Unspecified intracranial injury with loss of consciousness status unknown, sequela: Secondary | ICD-10-CM | POA: Insufficient documentation

## 2012-12-25 DIAGNOSIS — S069X0S Unspecified intracranial injury without loss of consciousness, sequela: Secondary | ICD-10-CM

## 2012-12-25 DIAGNOSIS — E049 Nontoxic goiter, unspecified: Secondary | ICD-10-CM

## 2012-12-25 DIAGNOSIS — M7650 Patellar tendinitis, unspecified knee: Secondary | ICD-10-CM | POA: Insufficient documentation

## 2012-12-25 DIAGNOSIS — M1710 Unilateral primary osteoarthritis, unspecified knee: Secondary | ICD-10-CM

## 2012-12-25 DIAGNOSIS — M542 Cervicalgia: Secondary | ICD-10-CM | POA: Insufficient documentation

## 2012-12-25 DIAGNOSIS — R209 Unspecified disturbances of skin sensation: Secondary | ICD-10-CM | POA: Insufficient documentation

## 2012-12-25 MED ORDER — METHYLPHENIDATE HCL ER (LA) 20 MG PO CP24: 20.0000 mg | ORAL_CAPSULE | ORAL | Status: DC
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
## 2012-12-25 NOTE — Patient Instructions (Signed)
CONTINUE TO WORK ON POSTURE.   CONSIDER NEOPRENE KNEE SLEEVES FOR YOUR KNEES WHEN YOU WALK  CONTACT YOUR SURGEON REGARDING YOUR THYROID.  CALL ME WITH ANY PROBLEMS OR QUESTIONS (#[phone]).  HAVE A GOOD DAY

## 2013-03-24 ENCOUNTER — Encounter: Payer: Self-pay | Admitting: Physical Medicine & Rehabilitation

## 2013-03-24 ENCOUNTER — Encounter: Payer: Medicare Other | Attending: Physical Medicine & Rehabilitation | Admitting: Physical Medicine & Rehabilitation

## 2013-03-24 VITALS — BP 112/58 | HR 78 | Resp 16 | Ht 59.0 in | Wt 135.0 lb

## 2013-03-24 DIAGNOSIS — S069XAS Unspecified intracranial injury with loss of consciousness status unknown, sequela: Secondary | ICD-10-CM | POA: Insufficient documentation

## 2013-03-24 DIAGNOSIS — F329 Major depressive disorder, single episode, unspecified: Secondary | ICD-10-CM

## 2013-03-24 DIAGNOSIS — X58XXXS Exposure to other specified factors, sequela: Secondary | ICD-10-CM | POA: Insufficient documentation

## 2013-03-24 DIAGNOSIS — M7061 Trochanteric bursitis, right hip: Secondary | ICD-10-CM

## 2013-03-24 DIAGNOSIS — F3289 Other specified depressive episodes: Secondary | ICD-10-CM | POA: Insufficient documentation

## 2013-03-24 DIAGNOSIS — G8929 Other chronic pain: Secondary | ICD-10-CM

## 2013-03-24 DIAGNOSIS — M76899 Other specified enthesopathies of unspecified lower limb, excluding foot: Secondary | ICD-10-CM

## 2013-03-24 DIAGNOSIS — M7650 Patellar tendinitis, unspecified knee: Secondary | ICD-10-CM

## 2013-03-24 DIAGNOSIS — S069X0S Unspecified intracranial injury without loss of consciousness, sequela: Secondary | ICD-10-CM

## 2013-03-24 DIAGNOSIS — S069X9S Unspecified intracranial injury with loss of consciousness of unspecified duration, sequela: Secondary | ICD-10-CM

## 2013-03-24 MED ORDER — METHYLPHENIDATE HCL ER (LA) 20 MG PO CP24: 20.0000 mg | ORAL_CAPSULE | ORAL | Status: DC
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
## 2013-03-24 NOTE — Patient Instructions (Addendum)
CALL ME WITH ANY PROBLEMS OR QUESTIONS (#[phone]).  HAVE A GOOD DAY   PLEASE FOLLOW UP ON YOUR THYROID TESTS. I WILL MAKE A SURGICAL REFERRAL IF YOU WOULD LIKE.

## 2013-06-23 ENCOUNTER — Encounter: Payer: Self-pay | Admitting: Physical Medicine & Rehabilitation

## 2013-06-23 ENCOUNTER — Encounter: Payer: Medicare Other | Attending: Physical Medicine & Rehabilitation | Admitting: Physical Medicine & Rehabilitation

## 2013-06-23 VITALS — BP 121/82 | HR 67 | Resp 14 | Ht 59.0 in | Wt 117.0 lb

## 2013-06-23 DIAGNOSIS — M179 Osteoarthritis of knee, unspecified: Secondary | ICD-10-CM

## 2013-06-23 DIAGNOSIS — S069XAA Unspecified intracranial injury with loss of consciousness status unknown, initial encounter: Secondary | ICD-10-CM

## 2013-06-23 DIAGNOSIS — F32A Depression, unspecified: Secondary | ICD-10-CM

## 2013-06-23 DIAGNOSIS — G8929 Other chronic pain: Secondary | ICD-10-CM

## 2013-06-23 DIAGNOSIS — S069X9A Unspecified intracranial injury with loss of consciousness of unspecified duration, initial encounter: Secondary | ICD-10-CM

## 2013-06-23 DIAGNOSIS — X58XXXS Exposure to other specified factors, sequela: Secondary | ICD-10-CM | POA: Insufficient documentation

## 2013-06-23 DIAGNOSIS — M1710 Unilateral primary osteoarthritis, unspecified knee: Secondary | ICD-10-CM

## 2013-06-23 DIAGNOSIS — M7060 Trochanteric bursitis, unspecified hip: Secondary | ICD-10-CM

## 2013-06-23 DIAGNOSIS — M76899 Other specified enthesopathies of unspecified lower limb, excluding foot: Secondary | ICD-10-CM

## 2013-06-23 DIAGNOSIS — M7650 Patellar tendinitis, unspecified knee: Secondary | ICD-10-CM

## 2013-06-23 DIAGNOSIS — F3289 Other specified depressive episodes: Secondary | ICD-10-CM | POA: Insufficient documentation

## 2013-06-23 DIAGNOSIS — IMO0002 Reserved for concepts with insufficient information to code with codable children: Secondary | ICD-10-CM

## 2013-06-23 DIAGNOSIS — F329 Major depressive disorder, single episode, unspecified: Secondary | ICD-10-CM

## 2013-06-23 MED ORDER — METHYLPHENIDATE HCL ER (LA) 20 MG PO CP24: 20.0000 mg | ORAL_CAPSULE | ORAL | Status: DC

## 2013-06-23 MED ORDER — CITALOPRAM HYDROBROMIDE 20 MG PO TABS: 40.0000 mg | ORAL_TABLET | Freq: Every day | ORAL | Status: DC

## 2013-06-23 NOTE — Patient Instructions (Addendum)
PLEASE CALL ME WITH ANY PROBLEMS OR QUESTIONS [phone]     CONTACT ME WHEN YOU NEED RITALIN REFILL.
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
## 2013-09-22 ENCOUNTER — Encounter: Payer: Self-pay | Admitting: Physical Medicine & Rehabilitation

## 2013-09-22 ENCOUNTER — Encounter: Payer: Medicare Other | Attending: Physical Medicine & Rehabilitation | Admitting: Physical Medicine & Rehabilitation

## 2013-09-22 VITALS — BP 139/90 | HR 65 | Resp 14 | Ht <= 58 in | Wt 106.0 lb

## 2013-09-22 DIAGNOSIS — S069XAA Unspecified intracranial injury with loss of consciousness status unknown, initial encounter: Secondary | ICD-10-CM | POA: Diagnosis present

## 2013-09-22 DIAGNOSIS — F32A Depression, unspecified: Secondary | ICD-10-CM

## 2013-09-22 DIAGNOSIS — F3289 Other specified depressive episodes: Secondary | ICD-10-CM | POA: Insufficient documentation

## 2013-09-22 DIAGNOSIS — F329 Major depressive disorder, single episode, unspecified: Secondary | ICD-10-CM | POA: Diagnosis present

## 2013-09-22 DIAGNOSIS — S069X9A Unspecified intracranial injury with loss of consciousness of unspecified duration, initial encounter: Secondary | ICD-10-CM | POA: Insufficient documentation

## 2013-09-22 DIAGNOSIS — M7060 Trochanteric bursitis, unspecified hip: Secondary | ICD-10-CM

## 2013-09-22 DIAGNOSIS — M7650 Patellar tendinitis, unspecified knee: Secondary | ICD-10-CM

## 2013-09-22 DIAGNOSIS — IMO0002 Reserved for concepts with insufficient information to code with codable children: Secondary | ICD-10-CM | POA: Diagnosis present

## 2013-09-22 DIAGNOSIS — M1710 Unilateral primary osteoarthritis, unspecified knee: Secondary | ICD-10-CM

## 2013-09-22 DIAGNOSIS — X58XXXS Exposure to other specified factors, sequela: Secondary | ICD-10-CM | POA: Insufficient documentation

## 2013-09-22 DIAGNOSIS — M76899 Other specified enthesopathies of unspecified lower limb, excluding foot: Secondary | ICD-10-CM | POA: Insufficient documentation

## 2013-09-22 DIAGNOSIS — M179 Osteoarthritis of knee, unspecified: Secondary | ICD-10-CM

## 2013-09-22 MED ORDER — CITALOPRAM HYDROBROMIDE 20 MG PO TABS: 40.0000 mg | ORAL_TABLET | Freq: Every day | ORAL | Status: DC

## 2013-09-22 MED ORDER — METHYLPHENIDATE HCL ER (LA) 20 MG PO CP24: 20.0000 mg | ORAL_CAPSULE | ORAL | Status: DC

## 2013-09-22 MED ORDER — NAPROXEN 500 MG PO TABS: 500.0000 mg | ORAL_TABLET | Freq: Two times a day (BID) | ORAL | Status: DC

## 2013-09-22 NOTE — Patient Instructions (Signed)
PLEASE CALL ME WITH ANY PROBLEMS OR QUESTIONS (#[phone]).      

## 2013-09-22 NOTE — Progress Notes (Signed)
Subjective:    Patient ID: Julie Simpson, female    DOB: 09-05-1965, 48 y.o.   MRN: [id number]  HPI  Julie Simpson is back regarding her chronic pain. She is living with her daughter and family. She had a fall a few weeks ago and hit the entertainment cabinet. She uses a quad cane within the house. She lost her rolling walker. Her daughter notes that she slurs her speech especially in the evening hours. She is limited by memory and attention as well. When she left her husband he did not provide her with a lot of her regular meds. I believe the only medications she is currently still taking are gabapentin, xanax, and trazodone, perhaps inderal also.   She has lost further weight since leaving, but is now eating well, 3 meals per day prepared by her family.    Pain Inventory Average Pain 9 Pain Right Now 9 My pain is sharp and aching  In the last 24 hours, has pain interfered with the following? General activity 6 Relation with others 5 Enjoyment of life 5 What TIME of day is your pain at its worst? constant all day Sleep (in general) Fair  Pain is worse with: bending and standing Pain improves with: rest and medication Relief from Meds: 4  Mobility walk with assistance use a cane ability to climb steps?  no do you drive?  no needs help with transfers  Function disabled: date disabled na I need assistance with the following:  bathing, meal prep, household duties and shopping  Neuro/Psych trouble walking confusion depression anxiety  Prior Studies Any changes since last visit?  no  Physicians involved in your care Any changes since last visit?  no   Family History  Problem Relation Age of Onset  . Cancer Mother 38    brain tumor  . Diabetes Father    History   Social History  . Marital Status: Married    Spouse Name: N/A    Number of Children: N/A  . Years of Education: N/A   Social History Main Topics  . Smoking status: Current Every Day Smoker -- 1.00  packs/day  . Smokeless tobacco: Never Used  . Alcohol Use: None  . Drug Use: None  . Sexual Activity: None   Other Topics Concern  . None   Social History Narrative  . None   Past Surgical History  Procedure Laterality Date  . Hernia repair    . Colon surgery      polypectomy  . Tubal ligation     Past Medical History  Diagnosis Date  . 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 Personality change due to conditions classified elsewhere   . Calcifying tendinitis of shoulder   . Bicipital tenosynovitis   . Cervical spondylosis without myelopathy    BP 139/90  Pulse 65  Resp 14  Ht 4\' 10"  (1.473 m)  Wt 106 lb (48.081 kg)  BMI 22.16 kg/m2  SpO2 97%  Opioid Risk Score:   Fall Risk Score: Moderate Fall Risk (6-13 points) (pt educated on fall risk, brochure given to pt previously)   Review of Systems  Musculoskeletal: Positive for gait problem.  Psychiatric/Behavioral: Positive for confusion. The patient is nervous/anxious.        Depression  All other systems reviewed and are negative.      Objective:   Physical Exam  HENT:  Head: Normocephalic and atraumatic.  Eyes:  Conjunctivae and EOM are normal. Pupils are equal, round, and reactive to light.  Neck: Normal range of motion. Neck supple.  Cardiovascular: Normal rate and regular rhythm.  Pulmonary/Chest: Effort normal and breath sounds normal.  Abdominal: Soft. Bowel sounds are normal.  Musculoskeletal: Her posture was improved  Right hip: She exhibits decreased range of motion, decreased strength, tenderness and bony tenderness.  Neurological: She is alert. She walks with wide based gait, her left knee tends to fall into a varus position but lesser so.  Psychiatric: Her speech is normal and behavior is normal. Thought content normal. Her affect is more dynamic. Cognition and memory are impaired. She exhibits abnormal  recent memory. Her initiation still is lacking. She went to the bathroom today and did not remember how to get back to the exam room she was in.     Assessment & Plan:   ASSESSMENT:  1. 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  2. Bilateral knee pain with patellar tendinitis, and likely small meniscal injuries  3. Centralized pain syndrome.  4. Chronic cervicalgia.  5. Right greater trochanter bursitis  6. Anxiety and depression      PLAN:  1. Needs to work on establishing a routine with memory/cognitive tasks. I reviewed this at length with her daughter who was here today.  2. Encouraged ongoing efforts to improve posture and lower extremity strength.   3. Resume celexa at 20mg  qhs initially. May go up to 40mg  if needed.  4. Methyphenidate to 20mg  ER for arousal and attention. A second rx was given for next month.  5. Refilled her naproxen today. I am not so concerned about her weight loss right now because she is apparently eating well. The weight should stabilize moving forward. She does not look unhealthy today.  6. I will see her back in about 2 months. I reviewed the plan at length with her daughter today. Hopefully she will find some footing with the support of her family at home.

## 2013-10-21 ENCOUNTER — Telehealth: Payer: Self-pay | Admitting: *Deleted
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
## 2013-10-27 ENCOUNTER — Telehealth: Payer: Self-pay
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
## 2013-10-28 NOTE — Telephone Encounter (Signed)
Contacted patient to inform her that Dr. Riley KillSwartz is not willing to prescribe narcotics for her. Patient verbalized understanding.

## 2013-12-23 ENCOUNTER — Encounter: Payer: Self-pay | Admitting: Physical Medicine & Rehabilitation

## 2013-12-23 ENCOUNTER — Encounter: Payer: Medicare Other | Attending: Physical Medicine & Rehabilitation | Admitting: Physical Medicine & Rehabilitation

## 2013-12-23 VITALS — BP 115/75 | HR 89 | Resp 16 | Ht <= 58 in | Wt 101.6 lb

## 2013-12-23 DIAGNOSIS — S069X9A Unspecified intracranial injury with loss of consciousness of unspecified duration, initial encounter: Secondary | ICD-10-CM | POA: Diagnosis present

## 2013-12-23 DIAGNOSIS — Z5189 Encounter for other specified aftercare: Secondary | ICD-10-CM

## 2013-12-23 DIAGNOSIS — IMO0002 Reserved for concepts with insufficient information to code with codable children: Secondary | ICD-10-CM | POA: Diagnosis present

## 2013-12-23 DIAGNOSIS — M7060 Trochanteric bursitis, unspecified hip: Secondary | ICD-10-CM

## 2013-12-23 DIAGNOSIS — M76899 Other specified enthesopathies of unspecified lower limb, excluding foot: Secondary | ICD-10-CM | POA: Diagnosis present

## 2013-12-23 DIAGNOSIS — S069XAA Unspecified intracranial injury with loss of consciousness status unknown, initial encounter: Secondary | ICD-10-CM | POA: Diagnosis present

## 2013-12-23 DIAGNOSIS — S069X5D Unspecified intracranial injury with loss of consciousness greater than 24 hours with return to pre-existing conscious level, subsequent encounter: Secondary | ICD-10-CM

## 2013-12-23 DIAGNOSIS — F3289 Other specified depressive episodes: Secondary | ICD-10-CM | POA: Insufficient documentation

## 2013-12-23 DIAGNOSIS — F32A Depression, unspecified: Secondary | ICD-10-CM

## 2013-12-23 DIAGNOSIS — M170 Bilateral primary osteoarthritis of knee: Secondary | ICD-10-CM

## 2013-12-23 DIAGNOSIS — G8929 Other chronic pain: Secondary | ICD-10-CM

## 2013-12-23 DIAGNOSIS — M1710 Unilateral primary osteoarthritis, unspecified knee: Secondary | ICD-10-CM

## 2013-12-23 DIAGNOSIS — M7650 Patellar tendinitis, unspecified knee: Secondary | ICD-10-CM | POA: Insufficient documentation

## 2013-12-23 DIAGNOSIS — E43 Unspecified severe protein-calorie malnutrition: Secondary | ICD-10-CM | POA: Insufficient documentation

## 2013-12-23 DIAGNOSIS — F329 Major depressive disorder, single episode, unspecified: Secondary | ICD-10-CM | POA: Diagnosis present

## 2013-12-23 LAB — COMPREHENSIVE METABOLIC PANEL
ALK PHOS: 66 U/L (ref 39–117)
ALT: 29 U/L (ref 0–35)
AST: 29 U/L (ref 0–37)
Albumin: 4.1 g/dL (ref 3.5–5.2)
BILIRUBIN TOTAL: 0.5 mg/dL (ref 0.2–1.2)
BUN: 12 mg/dL (ref 6–23)
CO2: 26 mEq/L (ref 19–32)
Calcium: 10.1 mg/dL (ref 8.4–10.5)
Chloride: 101 mEq/L (ref 96–112)
Creat: 0.9 mg/dL (ref 0.50–1.10)
Glucose, Bld: 101 mg/dL — ABNORMAL HIGH (ref 70–99)
Potassium: 3.7 mEq/L (ref 3.5–5.3)
Sodium: 139 mEq/L (ref 135–145)
Total Protein: 6.7 g/dL (ref 6.0–8.3)

## 2013-12-23 LAB — CBC
HCT: 50.1 % — ABNORMAL HIGH (ref 36.0–46.0)
Hemoglobin: 15.7 g/dL — ABNORMAL HIGH (ref 12.0–15.0)
MCH: 29.9 pg (ref 26.0–34.0)
MCHC: 31.2 g/dL (ref 30.0–36.0)
MCV: 95.6 fL (ref 78.0–100.0)
PLATELETS: 141 10*3/uL — AB (ref 150–400)
RBC: 5.25 MIL/uL — ABNORMAL HIGH (ref 3.87–5.11)
RDW: 15.2 % (ref 11.5–15.5)
WBC: 8 10*3/uL (ref 4.0–10.5)

## 2013-12-23 LAB — PREALBUMIN: Prealbumin: 21.9 mg/dL (ref 17.0–34.0)

## 2013-12-23 LAB — TSH: TSH: <0.008 u[IU]/mL — ABNORMAL LOW (ref 0.350–4.500)

## 2013-12-23 MED ORDER — METHYLPHENIDATE HCL ER (XR) 10 MG PO CP24: 10.0000 mg | ORAL_CAPSULE | ORAL | Status: DC

## 2013-12-23 MED ORDER — MIRTAZAPINE 15 MG PO TABS: 15.0000 mg | ORAL_TABLET | Freq: Every day | ORAL | Status: DC

## 2013-12-23 NOTE — Progress Notes (Signed)
Subjective:    Patient ID: Julie Simpson, female    DOB: 01/28/1966, 48 y.o.   MRN: [id number]  HPI  Kamille is back regarding her chronic pain. She continues to struggle at home. She fell again recently but doesn't remember exactly why. She is eating well but can't put on weight. She has had serious diarrhea, but apparently her family doctor will no longer see her given the events of earlier this year.  Her daugthter tries to support her as much as she can but [place] doesn't initiate much on her own. Her sleep is poor. She states that her knees constantly hurt. She feels that they will buckle.   She pretty much is sitting around the house most of the day.   Pain Inventory Average Pain 8 Pain Right Now 8 My pain is sharp, stabbing and aching  In the last 24 hours, has pain interfered with the following? General activity 5 Relation with others 7 Enjoyment of life 4 What TIME of day is your pain at its worst? constant all day Sleep (in general) Fair  Pain is worse with: walking and standing Pain improves with: medication Relief from Meds: 0  Mobility walk with assistance ability to climb steps?  no do you drive?  no needs help with transfers  Function not employed: date last employed na I need assistance with the following:  meal prep and household duties  Neuro/Psych bowel control problems trouble walking confusion depression anxiety  Prior Studies Any changes since last visit?  no  Physicians involved in your care Any changes since last visit?  no   Family History  Problem Relation Age of Onset  . Cancer Mother 17    brain tumor  . Diabetes Father    History   Social History  . Marital Status: Married    Spouse Name: N/A    Number of Children: N/A  . Years of Education: N/A   Social History Main Topics  . Smoking status: Current Every Day Smoker -- 1.00 packs/day  . Smokeless tobacco: Never Used  . Alcohol Use: None  . Drug Use: None  . Sexual  Activity: None   Other Topics Concern  . None   Social History Narrative  . None   Past Surgical History  Procedure Laterality Date  . Hernia repair    . Colon surgery      polypectomy  . Tubal ligation     Past Medical History  Diagnosis Date  . 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 Personality change due to conditions classified elsewhere   . Calcifying tendinitis of shoulder   . Bicipital tenosynovitis   . Cervical spondylosis without myelopathy    BP 115/75  Pulse 89  Resp 16  Ht  (1.473 m)  Wt 101 lb 9.6 oz (46.085 kg)  BMI 21.24 kg/m2  SpO2 99%  Opioid Risk Score:   Fall Risk Score:      Review of Systems  Gastrointestinal: Positive for diarrhea.  Genitourinary:       Bowel control problems  Musculoskeletal: Positive for gait problem.  Psychiatric/Behavioral: Positive for confusion. The patient is nervous/anxious.        Depression  All other systems reviewed and are negative.      Objective:   Physical Exam  Gen: pale, has lost further weight HENT:  Head: Normocephalic and atraumatic.  Eyes: Conjunctivae and EOM are normal. Pupils  are equal, round, and reactive to light.  Neck: Normal range of motion. Neck supple.  Cardiovascular: Normal rate and regular rhythm.  Pulmonary/Chest: Effort normal and breath sounds normal.  Abdominal: Soft. Bowel sounds are normal.  Musculoskeletal: Her posture was improved  Right hip: She exhibits decreased range of motion, decreased strength, tenderness and bony tenderness Both knees are tender. Mild crepitus. No effusion. Knees tend to buckle when she stands. Gait otherwise shuffling. Neurological:  UES: 5/5. LES: 3+ to 4/5 throughout. No sensory dficits. .  Psychiatric: Her speech is normal    Affect is flat. Cognition and memory are impaired. She exhibits abnormal recent memory. Her initiation still is lacking.       Assessment & Plan:   ASSESSMENT:  1. 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  2. Bilateral knee pain with patellar tendinitis, and likely small meniscal injuries  3. Centralized pain syndrome.  4. Chronic cervicalgia.  5. Right greater trochanter bursitis  6. Anxiety and depression  7. Malnutrition    PLAN:  1. Re-image knees to assess for new changes given the increased pain. 2. Posture and lower extremity strength. Dedication to HEP also is needed. She needs to have a walker for ambulation if she's by herself 3. Dc celexa---will trial her on remeron to help with appetite and mood.  4. Decrease ritalin to  ER to see if it helps with weight gain/diarrhea  5. Stop naproxen given her bruising 6. Try probiotic for diarrhea, also prn imodium. If persistent, may need GI consult.   -check labwork today to assess electrolytes and blood counts  6. I will see her back in about 1 months. I reviewed the plan at length with her daughter today.   40 minutes of direct patient care

## 2013-12-23 NOTE — Patient Instructions (Signed)
You need to use walker when your are up by yourself

## 2014-01-08 ENCOUNTER — Encounter: Payer: Self-pay | Admitting: Physical Medicine & Rehabilitation

## 2014-01-20 ENCOUNTER — Encounter (HOSPITAL_BASED_OUTPATIENT_CLINIC_OR_DEPARTMENT_OTHER): Payer: Medicare Other | Admitting: Physical Medicine & Rehabilitation

## 2014-01-20 ENCOUNTER — Encounter: Payer: Self-pay | Admitting: Physical Medicine & Rehabilitation

## 2014-01-20 VITALS — BP 173/78 | HR 50 | Resp 14 | Ht <= 58 in | Wt 107.0 lb

## 2014-01-20 DIAGNOSIS — F3289 Other specified depressive episodes: Secondary | ICD-10-CM

## 2014-01-20 DIAGNOSIS — M7650 Patellar tendinitis, unspecified knee: Secondary | ICD-10-CM

## 2014-01-20 DIAGNOSIS — E049 Nontoxic goiter, unspecified: Secondary | ICD-10-CM

## 2014-01-20 DIAGNOSIS — G8929 Other chronic pain: Secondary | ICD-10-CM

## 2014-01-20 DIAGNOSIS — Z5189 Encounter for other specified aftercare: Secondary | ICD-10-CM

## 2014-01-20 DIAGNOSIS — M1712 Unilateral primary osteoarthritis, left knee: Secondary | ICD-10-CM

## 2014-01-20 DIAGNOSIS — F32A Depression, unspecified: Secondary | ICD-10-CM

## 2014-01-20 DIAGNOSIS — M1710 Unilateral primary osteoarthritis, unspecified knee: Secondary | ICD-10-CM | POA: Diagnosis not present

## 2014-01-20 DIAGNOSIS — S069X5S Unspecified intracranial injury with loss of consciousness greater than 24 hours with return to pre-existing conscious level, sequela: Secondary | ICD-10-CM

## 2014-01-20 DIAGNOSIS — S069X5D Unspecified intracranial injury with loss of consciousness greater than 24 hours with return to pre-existing conscious level, subsequent encounter: Secondary | ICD-10-CM

## 2014-01-20 DIAGNOSIS — S069XAS Unspecified intracranial injury with loss of consciousness status unknown, sequela: Secondary | ICD-10-CM

## 2014-01-20 DIAGNOSIS — IMO0002 Reserved for concepts with insufficient information to code with codable children: Secondary | ICD-10-CM | POA: Diagnosis not present

## 2014-01-20 DIAGNOSIS — E43 Unspecified severe protein-calorie malnutrition: Secondary | ICD-10-CM

## 2014-01-20 DIAGNOSIS — F329 Major depressive disorder, single episode, unspecified: Secondary | ICD-10-CM

## 2014-01-20 DIAGNOSIS — S069X9S Unspecified intracranial injury with loss of consciousness of unspecified duration, sequela: Secondary | ICD-10-CM

## 2014-01-20 MED ORDER — METHYLPHENIDATE HCL ER (XR) 10 MG PO CP24: 10.0000 mg | ORAL_CAPSULE | ORAL | Status: DC

## 2014-01-20 MED ORDER — MIRTAZAPINE 30 MG PO TABS: 30.0000 mg | ORAL_TABLET | Freq: Every day | ORAL | Status: DC

## 2014-01-20 MED ORDER — METHYLPHENIDATE HCL ER (XR) 10 MG PO CP24
10.0000 mg | ORAL_CAPSULE | ORAL | Status: DC
Start: 2014-01-20 — End: 2014-03-23

## 2014-01-20 MED ORDER — DICLOFENAC SODIUM 1 % TD GEL
1.0000 | Freq: Three times a day (TID) | TRANSDERMAL | Status: DC
Start: 2014-01-20 — End: 2014-12-14

## 2014-01-20 NOTE — Progress Notes (Signed)
Subjective:    Patient ID: Julie Simpson, female    DOB: 1966-02-09, 48 y.o.   MRN: [id number]  HPI  Julie Simpson is back regarding her chronic pain. She has done better with the remeron. She has put on weight. She is doing some walking at home.  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 Her diarrhea has been better. She is using occasional imodium to help with the loose stool.   Anxiety is still a problem. A lot of that surrounds her husband.         Pain Inventory Average Pain 8 Pain Right Now 8 My pain is sharp, stabbing and aching  In the last 24 hours, has pain interfered with the following? General activity 5 Relation with others 7 Enjoyment of life 4 What TIME of day is your pain at its worst? constant all day Sleep (in general) Fair  Pain is worse with: walking and standing Pain improves with: medication Relief from Meds: 0  Mobility walk with assistance ability to climb steps?  no do you drive?  no needs help with transfers  Function not employed: date last employed na I need assistance with the following:  meal prep and household duties  Neuro/Psych bowel control problems trouble walking confusion depression anxiety  Prior Studies Any changes since last visit?  no  Physicians involved in your care Any changes since last visit?  no   Family History  Problem Relation Age of Onset  . Cancer Mother [id number]    brain tumor  . Diabetes Father    History   Social History  . Marital Status: Married    Spouse Name: N/A    Number of Children: N/A  . Years of Education: N/A   Social History Main Topics  . Smoking status: Current Every Day Smoker -- 1.00 packs/day  . Smokeless tobacco: Never Used  . Alcohol Use: None  . Drug Use: None  . Sexual Activity: None   Other Topics Concern  . None   Social History Narrative  . None   Past Surgical History  Procedure Laterality Date  . Hernia repair    . Colon surgery     polypectomy  . Tubal ligation     Past Medical History  Diagnosis Date  . 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 Personality change due to conditions classified elsewhere   . Calcifying tendinitis of shoulder   . Bicipital tenosynovitis   . Cervical spondylosis without myelopathy    There were no vitals taken for this visit.  Opioid Risk Score:   Fall Risk Score:      Review of Systems  Gastrointestinal: Positive for diarrhea.  Genitourinary:       Bowel control problems  Musculoskeletal: Positive for gait problem.  Psychiatric/Behavioral: Positive for confusion. The patient is nervous/anxious.        Depression  All other systems reviewed and are negative.      Objective:   Physical Exam  Gen: pale, has lost further weight HENT:  Head: Normocephalic and atraumatic.  Eyes: Conjunctivae and EOM are normal. Pupils are equal, round, and reactive to light.  Neck: Normal range of motion. Neck supple.  Cardiovascular: Normal rate and regular rhythm.  Pulmonary/Chest: Effort normal and breath sounds normal.  Abdominal: Soft. Bowel sounds are normal.  Musculoskeletal: Her posture was improved  Right hip: She exhibits decreased range of motion,  decreased strength, tenderness and bony tenderness Both knees are tender. Mild crepitus still. No effusion. Gait still shuffling. Doesn't flex knee with bending.  Neurological:  UES: 5/5. LES: remains 3+ to 4/5 throughout. No sensory dficits. .  Psychiatric: Her speech is normal    Affect is flat. Cognition and memory are impaired. She exhibits abnormal recent memory. Her initiation still is better but still lacking.  Psych: mood is brighter. She initiates more.     Assessment & Plan:   ASSESSMENT:  1. 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  2. Bilateral knee pain with patellar tendinitis, and likely  small meniscal injuries  3. Centralized pain syndrome.  4. Chronic cervicalgia.  5. Right greater trochanter bursitis  6. Anxiety and depression  7. Malnutrition    PLAN:  1. Consider knee injections at some point, but the main issue here is her weak lower extremities. She HAS to increase strength!!! 2. Continue walker use 3. Continue remeron to help with appetite and mood. Increase remeron to 30mg  qhs. 4. Maintain ritalin to 10mg  ER to see if it helps with weight gain/diarrhea  5. Imodium and improved diet to help with diarrhea. Also needs help with anxiety mgt 6. I will see her back in about 2 months. I reviewed the plan at length with her daughter today.   30 minutes of direct patient care

## 2014-01-20 NOTE — Patient Instructions (Signed)
YOU HAVE TO STRENGTHEN YOUR LEGS!!!!!!  TRY A THERABAND AND INCREASE YOUR Rozell SearingWALKING!!!1

## 2014-03-23 ENCOUNTER — Encounter: Payer: Medicare HMO | Attending: Physical Medicine & Rehabilitation | Admitting: Physical Medicine & Rehabilitation

## 2014-03-23 ENCOUNTER — Encounter: Payer: Self-pay | Admitting: Physical Medicine & Rehabilitation

## 2014-03-23 VITALS — BP 159/82 | HR 54 | Resp 14 | Ht <= 58 in | Wt 110.0 lb

## 2014-03-23 DIAGNOSIS — S069X5D Unspecified intracranial injury with loss of consciousness greater than 24 hours with return to pre-existing conscious level, subsequent encounter: Secondary | ICD-10-CM

## 2014-03-23 DIAGNOSIS — S069X5S Unspecified intracranial injury with loss of consciousness greater than 24 hours with return to pre-existing conscious level, sequela: Secondary | ICD-10-CM

## 2014-03-23 DIAGNOSIS — G8929 Other chronic pain: Secondary | ICD-10-CM

## 2014-03-23 DIAGNOSIS — E43 Unspecified severe protein-calorie malnutrition: Secondary | ICD-10-CM

## 2014-03-23 DIAGNOSIS — F329 Major depressive disorder, single episode, unspecified: Secondary | ICD-10-CM | POA: Diagnosis present

## 2014-03-23 DIAGNOSIS — M7650 Patellar tendinitis, unspecified knee: Secondary | ICD-10-CM

## 2014-03-23 DIAGNOSIS — M1712 Unilateral primary osteoarthritis, left knee: Secondary | ICD-10-CM | POA: Diagnosis present

## 2014-03-23 DIAGNOSIS — F32A Depression, unspecified: Secondary | ICD-10-CM

## 2014-03-23 MED ORDER — METHYLPHENIDATE HCL ER (XR) 10 MG PO CP24: 10.0000 mg | ORAL_CAPSULE | ORAL | Status: DC

## 2014-03-23 NOTE — Progress Notes (Signed)
Subjective:    Patient ID: Julie Simpson [place], female    DOB: [birth year], 48 y.o.   MRN: [id number]  HPI   [name] is back regarding her chronic pain and TBI. She has been eating better. She has been exercising more although her knees continue to really bother her especially when she does a lot of walking. They swell up also.   She is more active around the house. She cooked thanksgiving dinner and was proud with the results. Her daughter continues to help support her and encourage better eating habits and exercise.   Pain Inventory Average Pain 8 Pain Right Now 9 My pain is sharp, stabbing and aching  In the last 24 hours, has pain interfered with the following? General activity 4 Relation with others 6 Enjoyment of life 4 What TIME of day is your pain at its worst? morning, daytime, evening, night Sleep (in general) Fair  Pain is worse with: walking, sitting and standing Pain improves with: no relief Relief from Meds: 4  Mobility use a walker ability to climb steps?  yes do you drive?  yes needs help with transfers Do you have any goals in this area?  yes  Function not employed: date last employed . I need assistance with the following:  meal prep and shopping  Neuro/Psych No problems in this area  Prior Studies Any changes since last visit?  no  Physicians involved in your care Any changes since last visit?  no   Family History  Problem Relation Age of Onset  . Cancer Mother [id number]    brain tumor  . Diabetes Father    History   Social History  . Marital Status: Married    Spouse Name: N/A    Number of Children: N/A  . Years of Education: N/A   Social History Main Topics  . Smoking status: Current Every Day Smoker -- 1.00 packs/day  . Smokeless tobacco: Never Used  . Alcohol Use: None  . Drug Use: None  . Sexual Activity: None   Other Topics Concern  . None   Social History Narrative   Past Surgical History  Procedure Laterality Date  . Hernia  repair    . Colon surgery      polypectomy  . Tubal ligation     Past Medical History  Diagnosis Date  . 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 Personality change due to conditions classified elsewhere   . Calcifying tendinitis of shoulder   . Bicipital tenosynovitis   . Cervical spondylosis without myelopathy    BP 159/82 mmHg  Pulse 54  Resp 14  Ht 4\' 10"  (1.473 m)  Wt 110 lb (49.896 kg)  BMI 23.00 kg/m2  SpO2 96%  Opioid Risk Score:   Fall Risk Score: Moderate Fall Risk (6-13 points) (fall prevention safety pamphlet given to patient) Review of Systems  All other systems reviewed and are negative.      Objective:   Physical Exam  Gen: pale, has lost further weight  HENT:  Head: Normocephalic and atraumatic.  Eyes: Conjunctivae and EOM are normal. Pupils are equal, round, and reactive to light.  Neck: Normal range of motion. Neck supple.  Cardiovascular: Normal rate and regular rhythm.  Pulmonary/Chest: Effort normal and breath sounds normal.  Abdominal: Soft. Bowel sounds are normal.  Musculoskeletal: Her posture was improved  Right hip: She exhibits decreased range of motion, decreased strength, tenderness and bony  tenderness  Both knees are tender. Mild crepitus still. Mild bilateral inferior knee effusion.pain with palpation around both patellar tendons medially and laterally.  Neurological: UES: 5/5. LES: remains 3+ to 4/5 throughout. No sensory dficits. . Psychiatric: Her speech is normal Affect is flat. Cognition and memory are impaired. She exhibits abnormal recent memory. Her initiation still is better but still lacking.  Psych: mood is brighter. She initiates more.     Assessment & Plan:   ASSESSMENT:  1. 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  2. Bilateral knee pain with patellar tendinitis, and likely small meniscal  injuries  3. Centralized pain syndrome.  4. Chronic cervicalgia.  5. Right greater trochanter bursitis  6. Anxiety and depression  7. Malnutrition--improving   PLAN:  1. After informed consent and preparation of the skin with betadine and isopropyl alcohol, I injected 6mg  (1cc) of celestone and 4cc of 1% lidocaine around the bilateral patellar tendons via ant/lat approach. Additionally, aspiration was performed prior to injection. The patient tolerated well, and no complications were encountered. Afterward the area was cleaned and dressed. Post- injection instructions were provided. She needs to increase her quad strength!!!   2. Continue walker use for safety 3. Continue remeron  30mg  qhs.  4. Refill  ritalin  10mg  ER  5.reviewed starting a routine and keeping diary to help with her memory.  6. I will see her back in about 2 months.   30 minutes of direct patient care

## 2014-03-23 NOTE — Patient Instructions (Addendum)
PLEASE CALL ME WITH ANY PROBLEMS OR QUESTIONS (#[phone]).     Patellar Tendinitis, Jumper's Knee with Rehab Tendinitis is inflammation of a tendon. Tendonitis of the tendon below the kneecap (patella) is known as patellar tendonitis. Patellar tendonitis is a common cause of pain below the kneecap (infrapatellar). Patellar tendonitis may involve a tear (strain) in the ligament. Strains are classified into three categories. Grade 1 strains cause pain, but the tendon is not lengthened. Grade 2 strains include a lengthened ligament, due to the ligament being stretched or partially ruptured. With grade 2 strains there is still function, although function may be decreased. Grade 3 strains involve a complete tear of the tendon or muscle, and function is usually impaired. Patellar tendon strains are usually grade 1 or 2.  SYMPTOMS   Pain, tenderness, swelling, warmth, or redness over the patellar tendon (just below the kneecap).  Pain and loss of strength (sometimes), with forcefully straightening the knee (especially when jumping or rising from a seated or squatting position), or bending the knee completely (squatting or kneeling).  Crackling sound (crepitation) when the tendon is moved or touched. CAUSES  Patellar tendonitis is caused by injury to the patellar tendon. The inflammation is the body's healing response. Common causes of injury include:  Stress from a sudden increase in intensity, frequency, or duration of training.  Overuse of the thigh muscles (quadriceps) and patellar tendon.  Direct hit (trauma) to the knee or patellar tendon. RISK INCREASES WITH:  Sports that require sudden, explosive quadriceps contraction, such as jumping, quick starts, or kicking.  Running sports, especially running down hills.  Poor strength and flexibility of the thigh and knee.  Flat feet. PREVENTION  Warm up and stretch properly before activity.  Allow for adequate recovery between  workouts.  Maintain physical fitness:  Strength, flexibility, and endurance.  Cardiovascular fitness.  Protect the knee joint with taping, protective strapping, bracing, or elastic compression bandage.  Wear arch supports (orthotics). PROGNOSIS  If treated properly, patellar tendonitis usually heals within 6 weeks.  RELATED COMPLICATIONS   Longer healing time if not properly treated or if not given enough time to heal.  Recurring symptoms if activity is resumed too soon, with overuse, with a direct blow, or when using poor technique.  If untreated, tendon rupture requiring surgery. TREATMENT Treatment first involves the use of ice and medicine to reduce pain and inflammation. The use of strengthening and stretching exercises may help reduce pain with activity. These exercises may be performed at home or with a therapist. Serious cases of tendonitis may require restraining the knee for 10 to 14 days to prevent stress on the tendon and to promote healing. Crutches may be used (uncommon) until you can walk without a limp. For cases in which nonsurgical treatment is unsuccessful, surgery may be advised to remove the inflamed tendon lining (sheath). Surgery is rare, and is only advised after at least 6 months of nonsurgical treatment. MEDICATION   If pain medicine is needed, nonsteroidal anti-inflammatory medicines (aspirin and ibuprofen), or other minor pain relievers (acetaminophen), are often advised.  Do not take pain medicine for 7 days before surgery.  Prescription pain relievers may be given if your caregiver thinks they are needed. Use only as directed and only as much as you need. HEAT AND COLD  Cold treatment (icing) should be applied for 10 to 15 minutes every 2 to 3 hours for inflammation and pain, and immediately after activity that aggravates your symptoms. Use ice packs or an ice  massage.  Heat treatment may be used before performing stretching and strengthening activities  prescribed by your caregiver, physical therapist, or athletic trainer. Use a heat pack or a warm water soak. SEEK MEDICAL CARE IF:  Symptoms get worse or do not improve in 2 weeks, despite treatment.  New, unexplained symptoms develop. (Drugs used in treatment may produce side effects.) EXERCISES RANGE OF MOTION (ROM) AND STRETCHING EXERCISES - Patellar Tendinitis (Jumper's Knee) These are some of the initial exercises with which you may start your rehabilitation program, until you see your caregiver again or until your symptoms are resolved. Remember:   Flexible tissue is more tolerant of the stresses placed on it during activities.  Each stretch should be held for 20 to 30 seconds.  A gentle stretching sensation should be felt. STRETCH - Hamstrings, Supine  Lie on your back. Loop a belt or towel over the ball of your right / left foot.  Straighten your right / left knee and slowly pull on the belt to raise your leg. Do not allow the right / left knee to bend. Keep your opposite leg flat on the floor.  Raise the leg until you feel a gentle stretch behind your right / left knee or thigh. Hold this position for __________ seconds. Repeat __________ times. Complete this stretch __________ times per day.  STRETCH - Hamstrings, Doorway  Lie on your back with your right / left leg extended and resting on the wall, and the opposite leg flat on the ground through the door. At first, position your bottom farther away from the wall.  Keep your right / left knee straight. If you feel a stretch behind your knee or thigh, hold this position for __________ seconds.  If you do not feel a stretch, scoot your bottom closer to the door, and hold __________ seconds. Repeat __________ times. Complete this stretch __________ times per day.  STRETCH - Hamstrings, Standing  Stand or sit and extend your right / left leg, placing your foot on a chair or foot stool.  Keep a slight arch in your low back and  your hips straight forward.  Lead with your chest and lean forward at the waist until you feel a gentle stretch in the back of your right / left knee or thigh. (When done correctly, this exercise requires leaning only a small distance.)  Hold this position for __________ seconds. Repeat __________ times. Complete this stretch __________ times per day. STRETCH - Adductors, Lunge  While standing, spread your legs, with your right / left leg behind you.  Lean away from your right / left leg by bending your opposite knee. You may rest your hands on your thigh for balance.  You should feel a stretch in your right / left inner thigh. Hold for __________ seconds. Repeat __________ times. Complete this exercise __________ times per day.  STRENGTHENING EXERCISES - Patellar Tendinitis (Jumper's Knee) These exercises may help you when beginning to rehabilitate your injury. They may resolve your symptoms with or without further involvement from your physician, physical therapist or athletic trainer. While completing these exercises, remember:   Muscles can gain both the endurance and the strength needed for everyday activities through controlled exercises.  Complete these exercises as instructed by your physician, physical therapist or athletic trainer. Increase the resistance and repetitions only as guided by your caregiver. STRENGTH - Quadriceps, Isometrics  Lie on your back with your right / left leg extended and your opposite knee bent.  Gradually tense the  muscles in the front of your right / left thigh. You should see either your kneecap slide up toward your hip or increased dimpling just above the knee. This motion will push the back of the knee down toward the floor, mat, or bed on which you are lying.  Hold the muscle as tight as you can, without increasing your pain, for __________ seconds.  Relax the muscles slowly and completely in between each repetition. Repeat __________ times.  Complete this exercise __________ times per day.  STRENGTH - Quadriceps, Short Arcs  Lie on your back. Place a __________ inch towel roll under your right / left knee, so that the knee bends slightly.  Raise only your lower leg by tightening the muscles in the front of your thigh. Do not allow your thigh to rise.  Hold this position for __________ seconds. Repeat __________ times. Complete this exercise __________ times per day.  OPTIONAL ANKLE WEIGHTS: Begin with ____________________, but DO NOT exceed ____________________. Increase in 1 pound/ 0.5 kilogram increments. STRENGTH - Quadriceps, Straight Leg Raises  Quality counts! Watch for signs that the quadriceps muscle is working, to be sure you are strengthening the correct muscles and not "cheating" by substituting with healthier muscles.  Lay on your back with your right / left leg extended and your opposite knee bent.  Tense the muscles in the front of your right / left thigh. You should see either your kneecap slide up or increased dimpling just above the knee. Your thigh may even shake a bit.  Tighten these muscles even more and raise your leg 4 to 6 inches off the floor. Hold for __________ seconds.  Keeping these muscles tense, lower your leg.  Relax the muscles slowly and completely between each repetition. Repeat __________ times. Complete this exercise __________ times per day.  STRENGTH - Quadriceps, Squats  Stand in a door frame so that your feet and knees are in line with the frame.  Use your hands for balance, not support, on the frame.  Slowly lower your weight, bending at the hips and knees. Keep your lower legs upright so that they are parallel with the door frame. Squat only within the range that does not increase your knee pain. Never let your hips drop below your knees.  Slowly return upright, pushing with your legs, not pulling with your hands. Repeat __________ times. Complete this exercise __________ times  per day.  STRENGTH - Quadriceps, Step-Downs  Stand on the edge of a step stool or stair. Be prepared to use a countertop or wall for balance, if needed.  Keeping your right / left knee directly over the middle of your foot, slowly touch your opposite heel to the floor or lower step. Do not go all the way to the floor if your knee pain increases; just go as far as you can without increased discomfort. Use your right / left leg muscles, not gravity to lower your body weight.  Slowly push your body weight back up to the starting position. Repeat __________ times. Complete this exercise __________ times per day.  Document Released: 04/10/2005 Document Revised: 08/25/2013 Document Reviewed: 07/23/2008 [facility] Patient Information 2015 [employer] [place]. This information is not intended to replace advice given to you by your health care provider. Make sure you discuss any questions you have with your health care provider.

## 2014-05-22 ENCOUNTER — Encounter: Payer: Medicare HMO | Attending: Physical Medicine & Rehabilitation | Admitting: Physical Medicine & Rehabilitation

## 2014-05-22 ENCOUNTER — Encounter: Payer: Self-pay | Admitting: Physical Medicine & Rehabilitation

## 2014-05-22 VITALS — BP 130/77 | HR 64 | Resp 14

## 2014-05-22 DIAGNOSIS — M1712 Unilateral primary osteoarthritis, left knee: Secondary | ICD-10-CM | POA: Diagnosis present

## 2014-05-22 DIAGNOSIS — S069X5D Unspecified intracranial injury with loss of consciousness greater than 24 hours with return to pre-existing conscious level, subsequent encounter: Secondary | ICD-10-CM | POA: Diagnosis present

## 2014-05-22 DIAGNOSIS — M170 Bilateral primary osteoarthritis of knee: Secondary | ICD-10-CM

## 2014-05-22 DIAGNOSIS — M7650 Patellar tendinitis, unspecified knee: Secondary | ICD-10-CM | POA: Diagnosis present

## 2014-05-22 DIAGNOSIS — F32A Depression, unspecified: Secondary | ICD-10-CM

## 2014-05-22 DIAGNOSIS — M7060 Trochanteric bursitis, unspecified hip: Secondary | ICD-10-CM

## 2014-05-22 DIAGNOSIS — E43 Unspecified severe protein-calorie malnutrition: Secondary | ICD-10-CM

## 2014-05-22 DIAGNOSIS — F329 Major depressive disorder, single episode, unspecified: Secondary | ICD-10-CM | POA: Insufficient documentation

## 2014-05-22 DIAGNOSIS — S069X4S Unspecified intracranial injury with loss of consciousness of 6 hours to 24 hours, sequela: Secondary | ICD-10-CM

## 2014-05-22 MED ORDER — BACLOFEN 10 MG PO TABS: 10.0000 mg | ORAL_TABLET | Freq: Three times a day (TID) | ORAL | Status: DC | PRN

## 2014-05-22 MED ORDER — TRAMADOL HCL 50 MG PO TABS: 50.0000 mg | ORAL_TABLET | Freq: Every day | ORAL | Status: DC | PRN

## 2014-05-22 NOTE — Progress Notes (Signed)
Subjective:    Patient ID: Julie Simpson, female    DOB: Oct 23, 1965, 49 y.o.   MRN: [id number]  HPI  Julie Simpson is back regarding her chronic pain and TBI. She is at her daughter's home who is now in an inpatient psych ward for "bipolar/multiple personality disorder."  Julie Simpson is by herself currently.   She had no response with the steroid injections we did in November. She feels that her knees are still tight. She also feels that there is some tightness in her left shoulder. She is trying to get out and [place] with her rolling walker. A neighbor often will go out with her.   She stopped the ritalin as it made her to hyper. She uses the voltaren gel for her hands and knees. She is also using some gabapentin but she ran out a few weeks ago--she is unsure if it helps.       Pain Inventory Average Pain 9 Pain Right Now 9 My pain is stabbing and aching  In the last 24 hours, has pain interfered with the following? General activity 4 Relation with others 8 Enjoyment of life 2 What TIME of day is your pain at its worst? all Sleep (in general) Fair  Pain is worse with: walking, bending, standing and some activites Pain improves with: no relief from anything Relief from Meds: 0  Mobility walk without assistance use a cane use a walker ability to climb steps?  no do you drive?  no needs help with transfers Do you have any goals in this area?  yes  Function not employed: date last employed . I need assistance with the following:  meal prep and household duties Do you have any goals in this area?  yes  Neuro/Psych bowel control problems trouble walking spasms confusion depression  Prior Studies Any changes since last visit?  no  Physicians involved in your care Any changes since last visit?  no   Family History  Problem Relation Age of Onset  . Cancer Mother 1    brain tumor  . Diabetes Father    History   Social History  . Marital Status: Married    Spouse Name:  N/A    Number of Children: N/A  . Years of Education: N/A   Social History Main Topics  . Smoking status: Current Every Day Smoker -- 1.00 packs/day  . Smokeless tobacco: Never Used  . Alcohol Use: None  . Drug Use: None  . Sexual Activity: None   Other Topics Concern  . None   Social History Narrative   Past Surgical History  Procedure Laterality Date  . Hernia repair    . Colon surgery      polypectomy  . Tubal ligation     Past Medical History  Diagnosis Date  . 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 Personality change due to conditions classified elsewhere   . Calcifying tendinitis of shoulder   . Bicipital tenosynovitis   . Cervical spondylosis without myelopathy    BP 130/77 mmHg  Pulse 64  Resp 14  SpO2 95%  Opioid Risk Score:   Fall Risk Score: Moderate Fall Risk (6-13 points) Review of Systems  Constitutional:       Night sweats  Gastrointestinal: Positive for diarrhea.  Musculoskeletal: Positive for gait problem.  Neurological:       Spasms  Psychiatric/Behavioral: Positive for confusion and dysphoric mood.  All other  systems reviewed and are negative.      Objective:   Physical Exam  Gen: pale, has lost further weight  HENT:  Head: Normocephalic and atraumatic.  Eyes: Conjunctivae and EOM are normal. Pupils are equal, round, and reactive to light.  Neck: Normal range of motion. Neck supple.  Cardiovascular: Normal rate and regular rhythm.  Pulmonary/Chest: Effort normal and breath sounds normal.  Abdominal: Soft. Bowel sounds are normal.  Musculoskeletal: Her posture was improved  Right hip: She exhibits decreased range of motion, decreased strength, tenderness and bony tenderness  Both knees are tender. Mild crepitus still. Mild bilateral inferior knee effusion.pain with palpation around both patellar tendons medially and laterally.    Neurological: UES: 5/5. LES: remains 3+ to 4/5 throughout. No sensory dficits. . Psychiatric: Her speech is normal Affect is flat. Cognition and memory are impaired. She exhibits abnormal recent memory. Her initiation is much better. Her awareness appears improved also.  Psych: mood is brighter.  .   Assessment & Plan:   ASSESSMENT:  1. 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  2. Bilateral knee pain with patellar tendinitis, and likely small meniscal injuries  3. Centralized pain syndrome.  4. Chronic cervicalgia.  5. Right greater trochanter bursitis  6. Anxiety and depression  7. Malnutrition--improving    PLAN:  1. Initiate a trial of baclofen, 10mg  q8 prn 2. Continue walker use for safety  3. Continue remeron 30mg  qhs.  4. Will give her a very limited supply of tramadol for more severe pain #30. 1 RF 5. Continued use of memory aid/calendar.  6. I will see her back in about 3 months.  7. Functional diarrhea: probiotic 30 minutes of direct patient care

## 2014-05-22 NOTE — Patient Instructions (Signed)
TRY A PROBIOTIC FOR YOUR DIARRHEA-----YOU CAN BUY CAPS[facility]ES AT THE STORE, OR YOU CAN BUY [facility] WHICH ALSO HAS IT.   IF DIARRHEA PERSISTS, YOU NEED TO CHECK WITH YOUR FAMILY DOCTOR

## 2014-07-21 ENCOUNTER — Telehealth: Payer: Self-pay | Admitting: *Deleted

## 2014-07-21 NOTE — Telephone Encounter (Signed)
[facility] calling for clarification on tramadol, how many she takes daily(?)  I called back with ref #[id number] and spoke with pharmacist.  Apparently they received a fax yesterday that had #90 but did not say how many per day she can take .  I verified that she is to have one daily as needed so the #90 is a 90 day supply.

## 2014-08-13 ENCOUNTER — Telehealth: Payer: Self-pay | Admitting: *Deleted

## 2014-08-13 NOTE — Telephone Encounter (Signed)
Recd faxed request for a refill for Voltaren Gel.  The information recd from the pharmacy and the information that we have on file for patient insurance does not match.  Tried to contact the patient to clarify coverage, left message.  Cannot complete prior authorization until we speak to patient about prescription coverage

## 2014-08-19 ENCOUNTER — Encounter: Payer: Medicare Other | Attending: Physical Medicine & Rehabilitation | Admitting: Physical Medicine & Rehabilitation

## 2014-08-19 ENCOUNTER — Encounter: Payer: Self-pay | Admitting: Physical Medicine & Rehabilitation

## 2014-08-19 VITALS — BP 114/65 | HR 93 | Resp 14

## 2014-08-19 DIAGNOSIS — M7650 Patellar tendinitis, unspecified knee: Secondary | ICD-10-CM | POA: Insufficient documentation

## 2014-08-19 DIAGNOSIS — F329 Major depressive disorder, single episode, unspecified: Secondary | ICD-10-CM | POA: Diagnosis not present

## 2014-08-19 DIAGNOSIS — M170 Bilateral primary osteoarthritis of knee: Secondary | ICD-10-CM | POA: Insufficient documentation

## 2014-08-19 DIAGNOSIS — F418 Other specified anxiety disorders: Secondary | ICD-10-CM | POA: Insufficient documentation

## 2014-08-19 DIAGNOSIS — M7060 Trochanteric bursitis, unspecified hip: Secondary | ICD-10-CM | POA: Diagnosis not present

## 2014-08-19 DIAGNOSIS — F32A Depression, unspecified: Secondary | ICD-10-CM

## 2014-08-19 DIAGNOSIS — S069X4S Unspecified intracranial injury with loss of consciousness of 6 hours to 24 hours, sequela: Secondary | ICD-10-CM | POA: Diagnosis not present

## 2014-08-19 DIAGNOSIS — F419 Anxiety disorder, unspecified: Secondary | ICD-10-CM

## 2014-08-19 MED ORDER — SERTRALINE HCL 25 MG PO TABS: 25.0000 mg | ORAL_TABLET | Freq: Every day | ORAL | Status: DC

## 2014-08-19 MED ORDER — TRAMADOL HCL 50 MG PO TABS: 50.0000 mg | ORAL_TABLET | Freq: Every day | ORAL | Status: DC | PRN

## 2014-08-19 NOTE — Patient Instructions (Signed)
PLEASE CALL ME WITH ANY PROBLEMS OR QUESTIONS (#[phone]).     LOOK TO YOUR FRIENDS FOR SUPPORT  FIND LEISURE ACTIVITIES AND OTHER ACTIVITIES TO HELP RELIEVE YOUR STRESS.
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
## 2014-12-14 ENCOUNTER — Encounter: Payer: Self-pay | Admitting: Physical Medicine & Rehabilitation

## 2014-12-14 ENCOUNTER — Encounter: Payer: Medicare Other | Attending: Physical Medicine & Rehabilitation | Admitting: Physical Medicine & Rehabilitation

## 2014-12-14 VITALS — BP 150/85 | HR 60 | Resp 14

## 2014-12-14 DIAGNOSIS — F419 Anxiety disorder, unspecified: Secondary | ICD-10-CM | POA: Insufficient documentation

## 2014-12-14 DIAGNOSIS — S069X5S Unspecified intracranial injury with loss of consciousness greater than 24 hours with return to pre-existing conscious level, sequela: Secondary | ICD-10-CM

## 2014-12-14 DIAGNOSIS — M542 Cervicalgia: Secondary | ICD-10-CM | POA: Diagnosis not present

## 2014-12-14 DIAGNOSIS — X58XXXS Exposure to other specified factors, sequela: Secondary | ICD-10-CM | POA: Insufficient documentation

## 2014-12-14 DIAGNOSIS — F17210 Nicotine dependence, cigarettes, uncomplicated: Secondary | ICD-10-CM | POA: Diagnosis not present

## 2014-12-14 DIAGNOSIS — G890 Central pain syndrome: Secondary | ICD-10-CM | POA: Diagnosis not present

## 2014-12-14 DIAGNOSIS — E46 Unspecified protein-calorie malnutrition: Secondary | ICD-10-CM | POA: Insufficient documentation

## 2014-12-14 DIAGNOSIS — M1712 Unilateral primary osteoarthritis, left knee: Secondary | ICD-10-CM

## 2014-12-14 DIAGNOSIS — M7652 Patellar tendinitis, left knee: Secondary | ICD-10-CM | POA: Insufficient documentation

## 2014-12-14 DIAGNOSIS — M7061 Trochanteric bursitis, right hip: Secondary | ICD-10-CM | POA: Diagnosis not present

## 2014-12-14 DIAGNOSIS — F418 Other specified anxiety disorders: Secondary | ICD-10-CM

## 2014-12-14 DIAGNOSIS — K219 Gastro-esophageal reflux disease without esophagitis: Secondary | ICD-10-CM

## 2014-12-14 DIAGNOSIS — Z79899 Other long term (current) drug therapy: Secondary | ICD-10-CM | POA: Insufficient documentation

## 2014-12-14 DIAGNOSIS — M7650 Patellar tendinitis, unspecified knee: Secondary | ICD-10-CM

## 2014-12-14 DIAGNOSIS — M170 Bilateral primary osteoarthritis of knee: Secondary | ICD-10-CM | POA: Diagnosis not present

## 2014-12-14 DIAGNOSIS — S069X0S Unspecified intracranial injury without loss of consciousness, sequela: Secondary | ICD-10-CM | POA: Insufficient documentation

## 2014-12-14 DIAGNOSIS — S069X4S Unspecified intracranial injury with loss of consciousness of 6 hours to 24 hours, sequela: Secondary | ICD-10-CM

## 2014-12-14 DIAGNOSIS — F329 Major depressive disorder, single episode, unspecified: Secondary | ICD-10-CM | POA: Insufficient documentation

## 2014-12-14 DIAGNOSIS — F32A Depression, unspecified: Secondary | ICD-10-CM

## 2014-12-14 DIAGNOSIS — M7651 Patellar tendinitis, right knee: Secondary | ICD-10-CM | POA: Insufficient documentation

## 2014-12-14 DIAGNOSIS — M7060 Trochanteric bursitis, unspecified hip: Secondary | ICD-10-CM | POA: Diagnosis not present

## 2014-12-14 DIAGNOSIS — R4189 Other symptoms and signs involving cognitive functions and awareness: Secondary | ICD-10-CM | POA: Diagnosis not present

## 2014-12-14 DIAGNOSIS — I10 Essential (primary) hypertension: Secondary | ICD-10-CM

## 2014-12-14 MED ORDER — PROPRANOLOL HCL 40 MG PO TABS: 40.0000 mg | ORAL_TABLET | Freq: Three times a day (TID) | ORAL | Status: DC

## 2014-12-14 MED ORDER — FUROSEMIDE 20 MG PO TABS: 20.0000 mg | ORAL_TABLET | Freq: Every day | ORAL | Status: DC

## 2014-12-14 MED ORDER — DILTIAZEM HCL ER COATED BEADS 120 MG PO CP24: 120.0000 mg | ORAL_CAPSULE | Freq: Every day | ORAL | Status: DC

## 2014-12-14 MED ORDER — SERTRALINE HCL 50 MG PO TABS: 50.0000 mg | ORAL_TABLET | Freq: Every day | ORAL | Status: DC

## 2014-12-14 MED ORDER — BACLOFEN 10 MG PO TABS: 10.0000 mg | ORAL_TABLET | Freq: Three times a day (TID) | ORAL | Status: DC | PRN

## 2014-12-14 MED ORDER — DICLOFENAC SODIUM 1 % TD GEL: 1.0000 "application " | Freq: Three times a day (TID) | TRANSDERMAL | Status: DC

## 2014-12-14 MED ORDER — LISINOPRIL 10 MG PO TABS: 10.0000 mg | ORAL_TABLET | Freq: Every day | ORAL | Status: DC

## 2014-12-14 MED ORDER — TRAZODONE HCL 100 MG PO TABS: 100.0000 mg | ORAL_TABLET | Freq: Every day | ORAL | Status: DC

## 2014-12-14 MED ORDER — OMEPRAZOLE 40 MG PO CPDR: 40.0000 mg | DELAYED_RELEASE_CAPSULE | Freq: Every day | ORAL | Status: DC

## 2014-12-14 MED ORDER — TRAMADOL HCL 50 MG PO TABS: 50.0000 mg | ORAL_TABLET | Freq: Every day | ORAL | Status: DC | PRN
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
## 2014-12-14 NOTE — Patient Instructions (Signed)
PLEASE CALL ME WITH ANY PROBLEMS OR QUESTIONS (#[phone]).  HAVE A GOOD DAY!    

## 2015-01-08 ENCOUNTER — Telehealth: Payer: Self-pay | Admitting: *Deleted

## 2015-01-08 DIAGNOSIS — M7650 Patellar tendinitis, unspecified knee: Secondary | ICD-10-CM

## 2015-01-08 DIAGNOSIS — F419 Anxiety disorder, unspecified: Secondary | ICD-10-CM

## 2015-01-08 DIAGNOSIS — M7060 Trochanteric bursitis, unspecified hip: Secondary | ICD-10-CM

## 2015-01-08 DIAGNOSIS — S069X5S Unspecified intracranial injury with loss of consciousness greater than 24 hours with return to pre-existing conscious level, sequela: Secondary | ICD-10-CM

## 2015-01-08 DIAGNOSIS — K219 Gastro-esophageal reflux disease without esophagitis: Secondary | ICD-10-CM

## 2015-01-08 DIAGNOSIS — F32A Depression, unspecified: Secondary | ICD-10-CM

## 2015-01-08 DIAGNOSIS — M170 Bilateral primary osteoarthritis of knee: Secondary | ICD-10-CM

## 2015-01-08 DIAGNOSIS — S069X4S Unspecified intracranial injury with loss of consciousness of 6 hours to 24 hours, sequela: Secondary | ICD-10-CM

## 2015-01-08 DIAGNOSIS — I10 Essential (primary) hypertension: Secondary | ICD-10-CM

## 2015-01-08 DIAGNOSIS — F329 Major depressive disorder, single episode, unspecified: Secondary | ICD-10-CM

## 2015-01-08 NOTE — Telephone Encounter (Signed)
We received a fax from [facility] pharmacy to place 90 day supply orders for all the medications you wrote scripts for on 12/14/2014. This includes lasix, propranolol, diltiazem, omeprazole, sertraline, tramadol, baclofen, and trazadone. Are there certain medications that our okay to place 90 day supply orders for? Should I go ahead and refill them all? How would you like me to proceed

## 2015-01-08 NOTE — Telephone Encounter (Signed)
She may have 90 day rxs for all of them except tramadol.  Thank you

## 2015-01-11 MED ORDER — FUROSEMIDE 20 MG PO TABS: 20.0000 mg | ORAL_TABLET | Freq: Every day | ORAL | Status: DC

## 2015-01-11 MED ORDER — OMEPRAZOLE 40 MG PO CPDR: 40.0000 mg | DELAYED_RELEASE_CAPSULE | Freq: Every day | ORAL | Status: DC

## 2015-01-11 MED ORDER — SERTRALINE HCL 50 MG PO TABS: 50.0000 mg | ORAL_TABLET | Freq: Every day | ORAL | Status: AC

## 2015-01-11 MED ORDER — DILTIAZEM HCL ER COATED BEADS 120 MG PO CP24: 120.0000 mg | ORAL_CAPSULE | Freq: Every day | ORAL | Status: DC

## 2015-01-11 MED ORDER — BACLOFEN 10 MG PO TABS: 10.0000 mg | ORAL_TABLET | Freq: Three times a day (TID) | ORAL | Status: AC | PRN

## 2015-01-11 MED ORDER — TRAZODONE HCL 100 MG PO TABS: 100.0000 mg | ORAL_TABLET | Freq: Every day | ORAL | Status: DC

## 2015-01-11 MED ORDER — PROPRANOLOL HCL 40 MG PO TABS: 40.0000 mg | ORAL_TABLET | Freq: Three times a day (TID) | ORAL | Status: DC

## 2015-01-11 MED ORDER — LISINOPRIL 10 MG PO TABS: 10.0000 mg | ORAL_TABLET | Freq: Every day | ORAL | Status: DC

## 2015-01-11 NOTE — Telephone Encounter (Signed)
90 day supply ordered for all meds except tramadol and xanax

## 2015-04-07 ENCOUNTER — Encounter: Payer: Medicare Other | Admitting: Physical Medicine & Rehabilitation

## 2015-04-14 ENCOUNTER — Telehealth: Payer: Self-pay | Admitting: *Deleted

## 2015-04-14 NOTE — Telephone Encounter (Signed)
Prior auth for diclofenac 1% gel approved through 12/25/2015

## 2016-06-23 ENCOUNTER — Other Ambulatory Visit: Payer: Self-pay | Admitting: Physical Medicine & Rehabilitation

## 2016-06-23 DIAGNOSIS — S069X5S Unspecified intracranial injury with loss of consciousness greater than 24 hours with return to pre-existing conscious level, sequela: Secondary | ICD-10-CM

## 2016-07-21 ENCOUNTER — Other Ambulatory Visit: Payer: Self-pay | Admitting: Physical Medicine & Rehabilitation

## 2016-07-21 DIAGNOSIS — S069X5S Unspecified intracranial injury with loss of consciousness greater than 24 hours with return to pre-existing conscious level, sequela: Secondary | ICD-10-CM

## 2016-07-24 NOTE — Congregational Nurse Program (Signed)
Congregational Nurse Program Note  Date of Encounter: 07/24/2016  Past Medical History: Past Medical History:  Diagnosis Date  . Bicipital tenosynovitis   . Calcifying tendinitis of shoulder   . Cervical spondylosis without myelopathy   . 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 Personality change due to conditions classified elsewhere     Encounter Details:     CNP Questionnaire - 07/20/16 1730      Patient Demographics   Is this a new or existing patient? New   Patient is considered a/an Not Applicable   Race Caucasian/White     Patient Assistance   Location of Patient Assistance Home of [address], [place]   Patient's financial/insurance status Medicaid;Medicare   Uninsured Patient ([employer]) No   Patient referred to apply for the following financial assistance Not Applicable   Food insecurities addressed Not Applicable   Transportation assistance Yes   Type of Assistance [facility]   Assistance securing medications No   Educational health offerings Hypertension;Navigating the healthcare system;Chronic disease     Encounter Details   Primary purpose of visit Chronic Illness/Condition Visit;Education/Health Concerns;Navigating the Healthcare System   Was an Emergency Department visit averted? Not Applicable   Does patient have a medical provider? Yes   Patient referred to Not Applicable   Was a mental health screening completed? (GAINS tool) No   Does patient have dental issues? No   Does patient have vision issues? No   Does your patient have an abnormal blood pressure today? No   Since previous encounter, have you referred patient for abnormal blood pressure that resulted in a new diagnosis or medication change? No   Does your patient have an abnormal blood glucose today? No   Since previous encounter, have you referred patient for abnormal blood glucose that  resulted in a new diagnosis or medication change? No   Was there a life-saving intervention made? No    Client missed MD appointment today  because she didn't know to call [facility]  3 days prior to appointment. Has to reschedule on next week BP 137/85, [place] [phone] [place], [phone]
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
## 2017-02-11 ENCOUNTER — Emergency Department (HOSPITAL_COMMUNITY): Payer: Medicare Other

## 2017-02-11 ENCOUNTER — Inpatient Hospital Stay (HOSPITAL_COMMUNITY): Payer: Medicare Other

## 2017-02-11 ENCOUNTER — Encounter (HOSPITAL_COMMUNITY): Payer: Self-pay | Admitting: Emergency Medicine

## 2017-02-11 ENCOUNTER — Inpatient Hospital Stay (HOSPITAL_COMMUNITY)
Admission: EM | Disposition: A | Discharge status: Skilled Nursing Facility | Payer: Self-pay | Source: Home / Self Care | Attending: Cardiology

## 2017-02-11 ENCOUNTER — Inpatient Hospital Stay (HOSPITAL_COMMUNITY)
Admission: EM | Admit: 2017-02-11 | Discharge: 2017-02-17 | DRG: 250 | Disposition: A | Discharge status: Skilled Nursing Facility | Payer: Medicare Other | Attending: Cardiology | Admitting: Cardiology

## 2017-02-11 DIAGNOSIS — I110 Hypertensive heart disease with heart failure: Secondary | ICD-10-CM | POA: Diagnosis present

## 2017-02-11 DIAGNOSIS — Z87820 Personal history of traumatic brain injury: Secondary | ICD-10-CM

## 2017-02-11 DIAGNOSIS — F329 Major depressive disorder, single episode, unspecified: Secondary | ICD-10-CM | POA: Diagnosis present

## 2017-02-11 DIAGNOSIS — Z885 Allergy status to narcotic agent status: Secondary | ICD-10-CM

## 2017-02-11 DIAGNOSIS — F32A Depression, unspecified: Secondary | ICD-10-CM | POA: Diagnosis present

## 2017-02-11 DIAGNOSIS — F321 Major depressive disorder, single episode, moderate: Secondary | ICD-10-CM | POA: Diagnosis not present

## 2017-02-11 DIAGNOSIS — M7060 Trochanteric bursitis, unspecified hip: Secondary | ICD-10-CM

## 2017-02-11 DIAGNOSIS — M170 Bilateral primary osteoarthritis of knee: Secondary | ICD-10-CM

## 2017-02-11 DIAGNOSIS — I2102 ST elevation (STEMI) myocardial infarction involving left anterior descending coronary artery: Secondary | ICD-10-CM | POA: Diagnosis present

## 2017-02-11 DIAGNOSIS — I25110 Atherosclerotic heart disease of native coronary artery with unstable angina pectoris: Secondary | ICD-10-CM | POA: Diagnosis present

## 2017-02-11 DIAGNOSIS — Z9861 Coronary angioplasty status: Secondary | ICD-10-CM

## 2017-02-11 DIAGNOSIS — M7650 Patellar tendinitis, unspecified knee: Secondary | ICD-10-CM

## 2017-02-11 DIAGNOSIS — Z880 Allergy status to penicillin: Secondary | ICD-10-CM | POA: Diagnosis not present

## 2017-02-11 DIAGNOSIS — I2510 Atherosclerotic heart disease of native coronary artery without angina pectoris: Secondary | ICD-10-CM

## 2017-02-11 DIAGNOSIS — N390 Urinary tract infection, site not specified: Secondary | ICD-10-CM | POA: Diagnosis present

## 2017-02-11 DIAGNOSIS — E871 Hypo-osmolality and hyponatremia: Secondary | ICD-10-CM | POA: Diagnosis not present

## 2017-02-11 DIAGNOSIS — Z720 Tobacco use: Secondary | ICD-10-CM | POA: Diagnosis present

## 2017-02-11 DIAGNOSIS — I444 Left anterior fascicular block: Secondary | ICD-10-CM | POA: Diagnosis present

## 2017-02-11 DIAGNOSIS — I5041 Acute combined systolic (congestive) and diastolic (congestive) heart failure: Secondary | ICD-10-CM | POA: Diagnosis present

## 2017-02-11 DIAGNOSIS — Z809 Family history of malignant neoplasm, unspecified: Secondary | ICD-10-CM | POA: Diagnosis not present

## 2017-02-11 DIAGNOSIS — S069X9A Unspecified intracranial injury with loss of consciousness of unspecified duration, initial encounter: Secondary | ICD-10-CM | POA: Diagnosis present

## 2017-02-11 DIAGNOSIS — S069X4S Unspecified intracranial injury with loss of consciousness of 6 hours to 24 hours, sequela: Secondary | ICD-10-CM

## 2017-02-11 DIAGNOSIS — I255 Ischemic cardiomyopathy: Secondary | ICD-10-CM | POA: Diagnosis present

## 2017-02-11 DIAGNOSIS — S069XAA Unspecified intracranial injury with loss of consciousness status unknown, initial encounter: Secondary | ICD-10-CM | POA: Diagnosis present

## 2017-02-11 DIAGNOSIS — F17210 Nicotine dependence, cigarettes, uncomplicated: Secondary | ICD-10-CM | POA: Diagnosis present

## 2017-02-11 DIAGNOSIS — I5043 Acute on chronic combined systolic (congestive) and diastolic (congestive) heart failure: Secondary | ICD-10-CM | POA: Diagnosis present

## 2017-02-11 DIAGNOSIS — I10 Essential (primary) hypertension: Secondary | ICD-10-CM | POA: Diagnosis not present

## 2017-02-11 DIAGNOSIS — E785 Hyperlipidemia, unspecified: Secondary | ICD-10-CM | POA: Diagnosis present

## 2017-02-11 DIAGNOSIS — R072 Precordial pain: Secondary | ICD-10-CM | POA: Diagnosis not present

## 2017-02-11 DIAGNOSIS — Z833 Family history of diabetes mellitus: Secondary | ICD-10-CM | POA: Diagnosis not present

## 2017-02-11 DIAGNOSIS — J449 Chronic obstructive pulmonary disease, unspecified: Secondary | ICD-10-CM | POA: Diagnosis present

## 2017-02-11 DIAGNOSIS — I214 Non-ST elevation (NSTEMI) myocardial infarction: Secondary | ICD-10-CM

## 2017-02-11 DIAGNOSIS — Z23 Encounter for immunization: Secondary | ICD-10-CM

## 2017-02-11 HISTORY — DX: Tobacco use: Z72.0

## 2017-02-11 HISTORY — DX: Hyperlipidemia, unspecified: E78.5

## 2017-02-11 HISTORY — DX: Essential (primary) hypertension: I10

## 2017-02-11 HISTORY — DX: Atherosclerotic heart disease of native coronary artery without angina pectoris: I25.10

## 2017-02-11 HISTORY — PX: INTRAVASCULAR ULTRASOUND/IVUS: CATH118244

## 2017-02-11 HISTORY — DX: ST elevation (STEMI) myocardial infarction involving left anterior descending coronary artery: I21.02

## 2017-02-11 HISTORY — PX: CORONARY BALLOON ANGIOPLASTY: CATH118233

## 2017-02-11 HISTORY — PX: LEFT HEART CATH AND CORONARY ANGIOGRAPHY: CATH118249

## 2017-02-11 LAB — CBC WITH DIFFERENTIAL/PLATELET
Basophils Absolute: 0 10*3/uL (ref 0.0–0.1)
Basophils Relative: 0 %
Eosinophils Absolute: 0 10*3/uL (ref 0.0–0.7)
Eosinophils Relative: 0 %
HEMATOCRIT: 40.1 % (ref 36.0–46.0)
HEMOGLOBIN: 13.9 g/dL (ref 12.0–15.0)
LYMPHS PCT: 16 %
Lymphs Abs: 2.3 10*3/uL (ref 0.7–4.0)
MCH: 31.7 pg (ref 26.0–34.0)
MCHC: 34.7 g/dL (ref 30.0–36.0)
MCV: 91.3 fL (ref 78.0–100.0)
MONO ABS: 0.8 10*3/uL (ref 0.1–1.0)
Monocytes Relative: 6 %
NEUTROS ABS: 11.1 10*3/uL — AB (ref 1.7–7.7)
NEUTROS PCT: 78 %
Platelets: 200 10*3/uL (ref 150–400)
RBC: 4.39 MIL/uL (ref 3.87–5.11)
RDW: 13.7 % (ref 11.5–15.5)
WBC: 14.2 10*3/uL — ABNORMAL HIGH (ref 4.0–10.5)

## 2017-02-11 LAB — COMPREHENSIVE METABOLIC PANEL
ALK PHOS: 79 U/L (ref 38–126)
ALT: 35 U/L (ref 14–54)
ANION GAP: 13 (ref 5–15)
AST: 131 U/L — ABNORMAL HIGH (ref 15–41)
Albumin: 3.9 g/dL (ref 3.5–5.0)
BILIRUBIN TOTAL: 0.7 mg/dL (ref 0.3–1.2)
BUN: 6 mg/dL (ref 6–20)
CALCIUM: 8.7 mg/dL — AB (ref 8.9–10.3)
CO2: 25 mmol/L (ref 22–32)
CREATININE: 0.82 mg/dL (ref 0.44–1.00)
Chloride: 101 mmol/L (ref 101–111)
GFR calc non Af Amer: >60 mL/min (ref >60)
Glucose, Bld: 117 mg/dL — ABNORMAL HIGH (ref 65–99)
Potassium: 2.9 mmol/L — ABNORMAL LOW (ref 3.5–5.1)
Sodium: 139 mmol/L (ref 135–145)
TOTAL PROTEIN: 6.8 g/dL (ref 6.5–8.1)

## 2017-02-11 LAB — CREATININE, SERUM
Creatinine, Ser: 0.64 mg/dL (ref 0.44–1.00)
GFR calc Af Amer: >60 mL/min (ref >60)

## 2017-02-11 LAB — POCT I-STAT TROPONIN I: Troponin i, poc: 6.89 ng/mL (ref 0.00–0.08)

## 2017-02-11 LAB — POCT I-STAT, CHEM 8
BUN: 6 mg/dL (ref 6–20)
CHLORIDE: 101 mmol/L (ref 101–111)
CREATININE: 0.7 mg/dL (ref 0.44–1.00)
Calcium, Ion: 1.05 mmol/L — ABNORMAL LOW (ref 1.15–1.40)
Glucose, Bld: 125 mg/dL — ABNORMAL HIGH (ref 65–99)
HEMATOCRIT: 43 % (ref 36.0–46.0)
Hemoglobin: 14.6 g/dL (ref 12.0–15.0)
POTASSIUM: 3.1 mmol/L — AB (ref 3.5–5.1)
Sodium: 142 mmol/L (ref 135–145)
TCO2: 26 mmol/L (ref 22–32)

## 2017-02-11 LAB — PROTIME-INR
INR: 1.09
PROTHROMBIN TIME: 14 s (ref 11.4–15.2)

## 2017-02-11 LAB — TROPONIN I: TROPONIN I: 54.47 ng/mL — AB (ref <0.03)

## 2017-02-11 LAB — MRSA PCR SCREENING: MRSA BY PCR: NEGATIVE

## 2017-02-11 SURGERY — LEFT HEART CATH AND CORONARY ANGIOGRAPHY
Anesthesia: LOCAL

## 2017-02-11 MED ORDER — BIVALIRUDIN BOLUS VIA INFUSION - CUPID
INTRAVENOUS | Status: DC | PRN
Start: 2017-02-11 — End: 2017-02-11
  Administered 2017-02-11: 37.5 mg via INTRAVENOUS

## 2017-02-11 MED ORDER — TRAZODONE HCL 100 MG PO TABS
100.0000 mg | ORAL_TABLET | Freq: Every day | ORAL | Status: DC
  Administered 2017-02-11 – 2017-02-16 (×6): 100 mg via ORAL
  Filled 2017-02-11 (×5): qty 2
  Filled 2017-02-11: qty 1

## 2017-02-11 MED ORDER — HEPARIN BOLUS VIA INFUSION
4000.0000 [IU] | Freq: Once | INTRAVENOUS | Status: AC
  Administered 2017-02-11: 4000 [IU] via INTRAVENOUS

## 2017-02-11 MED ORDER — POTASSIUM CHLORIDE CRYS ER 20 MEQ PO TBCR
40.0000 meq | EXTENDED_RELEASE_TABLET | Freq: Two times a day (BID) | ORAL | Status: DC
  Administered 2017-02-11 – 2017-02-12 (×4): 40 meq via ORAL
  Filled 2017-02-11 (×4): qty 2

## 2017-02-11 MED ORDER — FENTANYL CITRATE (PF) 100 MCG/2ML IJ SOLN
INTRAMUSCULAR | Status: AC
  Filled 2017-02-11: qty 2

## 2017-02-11 MED ORDER — LIDOCAINE HCL 2 % IJ SOLN
INTRAMUSCULAR | Status: DC | PRN
  Administered 2017-02-11: 10 mL
  Administered 2017-02-11: 2 mL

## 2017-02-11 MED ORDER — SODIUM CHLORIDE 0.9% FLUSH
3.0000 mL | Freq: Two times a day (BID) | INTRAVENOUS | Status: DC
  Administered 2017-02-11 – 2017-02-17 (×12): 3 mL via INTRAVENOUS

## 2017-02-11 MED ORDER — NITROGLYCERIN 1 MG/10 ML FOR IR/CATH LAB
INTRA_ARTERIAL | Status: DC | PRN
  Administered 2017-02-11: 100 ug via INTRACORONARY

## 2017-02-11 MED ORDER — BIVALIRUDIN TRIFLUOROACETATE 250 MG IV SOLR
INTRAVENOUS | Status: AC
  Filled 2017-02-11: qty 250

## 2017-02-11 MED ORDER — NITROGLYCERIN 1 MG/10 ML FOR IR/CATH LAB
INTRA_ARTERIAL | Status: AC
  Filled 2017-02-11: qty 10

## 2017-02-11 MED ORDER — MIDAZOLAM HCL 2 MG/2ML IJ SOLN
INTRAMUSCULAR | Status: DC | PRN
  Administered 2017-02-11 (×2): 1 mg via INTRAVENOUS

## 2017-02-11 MED ORDER — MIDAZOLAM HCL 2 MG/2ML IJ SOLN
INTRAMUSCULAR | Status: AC
  Filled 2017-02-11: qty 2

## 2017-02-11 MED ORDER — FENTANYL CITRATE (PF) 100 MCG/2ML IJ SOLN
INTRAMUSCULAR | Status: DC | PRN
  Administered 2017-02-11 (×2): 25 ug via INTRAVENOUS

## 2017-02-11 MED ORDER — IOPAMIDOL (ISOVUE-370) INJECTION 76%
INTRAVENOUS | Status: DC | PRN
  Administered 2017-02-11: 185 mL via INTRAVENOUS

## 2017-02-11 MED ORDER — HEPARIN (PORCINE) IN NACL 2-0.9 UNIT/ML-% IJ SOLN
INTRAMUSCULAR | Status: AC | PRN
  Administered 2017-02-11: 1000 mL

## 2017-02-11 MED ORDER — FUROSEMIDE 10 MG/ML IJ SOLN
20.0000 mg | Freq: Once | INTRAMUSCULAR | Status: AC
  Administered 2017-02-11: 20 mg via INTRAVENOUS
  Filled 2017-02-11: qty 2

## 2017-02-11 MED ORDER — LIDOCAINE HCL 2 % IJ SOLN
INTRAMUSCULAR | Status: AC
  Filled 2017-02-11: qty 10

## 2017-02-11 MED ORDER — IOPAMIDOL (ISOVUE-370) INJECTION 76%
INTRAVENOUS | Status: AC
Start: 2017-02-11 — End: 2017-02-11
  Filled 2017-02-11: qty 100

## 2017-02-11 MED ORDER — ATROPINE SULFATE 1 MG/10ML IJ SOSY
PREFILLED_SYRINGE | INTRAMUSCULAR | Status: AC
  Filled 2017-02-11: qty 10

## 2017-02-11 MED ORDER — LISINOPRIL 5 MG PO TABS
5.0000 mg | ORAL_TABLET | Freq: Every day | ORAL | Status: DC
  Administered 2017-02-11: 5 mg via ORAL
  Filled 2017-02-11: qty 1

## 2017-02-11 MED ORDER — VERAPAMIL HCL 2.5 MG/ML IV SOLN
INTRAVENOUS | Status: DC | PRN
  Administered 2017-02-11: 10 mL via INTRA_ARTERIAL

## 2017-02-11 MED ORDER — BACLOFEN 10 MG PO TABS
10.0000 mg | ORAL_TABLET | Freq: Three times a day (TID) | ORAL | Status: DC | PRN
  Filled 2017-02-11: qty 1

## 2017-02-11 MED ORDER — SODIUM CHLORIDE 0.9 % IV BOLUS (SEPSIS)
1000.0000 mL | Freq: Once | INTRAVENOUS | Status: AC
  Administered 2017-02-11: 1000 mL via INTRAVENOUS

## 2017-02-11 MED ORDER — CARVEDILOL 6.25 MG PO TABS
6.2500 mg | ORAL_TABLET | Freq: Two times a day (BID) | ORAL | Status: DC
  Administered 2017-02-11: 6.25 mg via ORAL
  Filled 2017-02-11: qty 1

## 2017-02-11 MED ORDER — ALUM & MAG HYDROXIDE-SIMETH 200-200-20 MG/5ML PO SUSP
30.0000 mL | Freq: Four times a day (QID) | ORAL | Status: DC | PRN
Start: 2017-02-11 — End: 2017-02-17
  Administered 2017-02-11 – 2017-02-17 (×9): 30 mL via ORAL
  Filled 2017-02-11 (×9): qty 30

## 2017-02-11 MED ORDER — ALPRAZOLAM 0.5 MG PO TABS
1.0000 mg | ORAL_TABLET | Freq: Three times a day (TID) | ORAL | Status: DC
  Administered 2017-02-11 – 2017-02-17 (×19): 1 mg via ORAL
  Filled 2017-02-11 (×13): qty 2
  Filled 2017-02-11: qty 4
  Filled 2017-02-11 (×5): qty 2

## 2017-02-11 MED ORDER — TICAGRELOR 90 MG PO TABS
ORAL_TABLET | ORAL | Status: AC
  Filled 2017-02-11: qty 1

## 2017-02-11 MED ORDER — NITROGLYCERIN IN D5W 200-5 MCG/ML-% IV SOLN: 0.0000 ug/min | Freq: Once | INTRAVENOUS | Status: DC

## 2017-02-11 MED ORDER — SERTRALINE HCL 50 MG PO TABS
50.0000 mg | ORAL_TABLET | Freq: Every day | ORAL | Status: DC
  Administered 2017-02-12 – 2017-02-17 (×6): 50 mg via ORAL
  Filled 2017-02-11 (×6): qty 1

## 2017-02-11 MED ORDER — SODIUM CHLORIDE 0.9 % IV SOLN: 250.0000 mL | INTRAVENOUS | Status: DC | PRN

## 2017-02-11 MED ORDER — ASPIRIN 81 MG PO CHEW
81.0000 mg | CHEWABLE_TABLET | Freq: Every day | ORAL | Status: DC
  Administered 2017-02-12 – 2017-02-15 (×4): 81 mg via ORAL
  Filled 2017-02-11 (×4): qty 1

## 2017-02-11 MED ORDER — ONDANSETRON HCL 4 MG/2ML IJ SOLN
4.0000 mg | Freq: Four times a day (QID) | INTRAMUSCULAR | Status: DC | PRN
  Administered 2017-02-12 – 2017-02-16 (×4): 4 mg via INTRAVENOUS
  Filled 2017-02-11 (×4): qty 2

## 2017-02-11 MED ORDER — INFLUENZA VAC SPLIT QUAD 0.5 ML IM SUSY
0.5000 mL | PREFILLED_SYRINGE | INTRAMUSCULAR | Status: AC
  Administered 2017-02-17: 0.5 mL via INTRAMUSCULAR
  Filled 2017-02-11: qty 0.5

## 2017-02-11 MED ORDER — HEPARIN (PORCINE) IN NACL 2-0.9 UNIT/ML-% IJ SOLN
INTRAMUSCULAR | Status: AC
  Filled 2017-02-11: qty 1000

## 2017-02-11 MED ORDER — BIVALIRUDIN TRIFLUOROACETATE 250 MG IV SOLR
INTRAVENOUS | Status: DC | PRN
  Administered 2017-02-11: 1.75 mg/kg/h via INTRAVENOUS

## 2017-02-11 MED ORDER — PNEUMOCOCCAL VAC POLYVALENT 25 MCG/0.5ML IJ INJ
0.5000 mL | INJECTION | INTRAMUSCULAR | Status: AC
  Administered 2017-02-17: 0.5 mL via INTRAMUSCULAR
  Filled 2017-02-11: qty 0.5

## 2017-02-11 MED ORDER — SODIUM CHLORIDE 0.9% FLUSH
3.0000 mL | INTRAVENOUS | Status: DC | PRN
  Administered 2017-02-12 – 2017-02-13 (×2): 3 mL via INTRAVENOUS
  Filled 2017-02-11 (×2): qty 3

## 2017-02-11 MED ORDER — HEPARIN SODIUM (PORCINE) 5000 UNIT/ML IJ SOLN
5000.0000 [IU] | Freq: Three times a day (TID) | INTRAMUSCULAR | Status: DC
  Administered 2017-02-11 – 2017-02-12 (×2): 5000 [IU] via SUBCUTANEOUS
  Filled 2017-02-11 (×2): qty 1

## 2017-02-11 MED ORDER — VERAPAMIL HCL 2.5 MG/ML IV SOLN
INTRAVENOUS | Status: AC
  Filled 2017-02-11: qty 2

## 2017-02-11 MED ORDER — NITROGLYCERIN 0.4 MG SL SUBL
0.4000 mg | SUBLINGUAL_TABLET | SUBLINGUAL | Status: AC | PRN
  Administered 2017-02-13 (×3): 0.4 mg via SUBLINGUAL
  Filled 2017-02-11 (×2): qty 1

## 2017-02-11 MED ORDER — ATROPINE SULFATE 1 MG/10ML IJ SOSY
PREFILLED_SYRINGE | INTRAMUSCULAR | Status: DC | PRN
  Administered 2017-02-11: 0.5 mg via INTRAVENOUS

## 2017-02-11 MED ORDER — IOPAMIDOL (ISOVUE-370) INJECTION 76%
INTRAVENOUS | Status: AC
  Filled 2017-02-11: qty 125

## 2017-02-11 MED ORDER — ATORVASTATIN CALCIUM 80 MG PO TABS
80.0000 mg | ORAL_TABLET | Freq: Every day | ORAL | Status: DC
Start: 2017-02-11 — End: 2017-02-17
  Administered 2017-02-11 – 2017-02-16 (×6): 80 mg via ORAL
  Filled 2017-02-11 (×6): qty 1

## 2017-02-11 MED ORDER — ACETAMINOPHEN 325 MG PO TABS
650.0000 mg | ORAL_TABLET | ORAL | Status: DC | PRN
  Administered 2017-02-13 – 2017-02-17 (×4): 650 mg via ORAL
  Filled 2017-02-11 (×4): qty 2

## 2017-02-11 MED ORDER — TICAGRELOR 90 MG PO TABS: ORAL_TABLET | ORAL | Status: DC | PRN

## 2017-02-11 SURGICAL SUPPLY — 29 items
BALLN EMERGE MR 2.5X15 (BALLOONS) ×2
BALLN SAPPHIRE 2.0X12 (BALLOONS) ×2
BALLOON EMERGE MR 2.5X15 (BALLOONS) IMPLANT
BALLOON SAPPHIRE 2.0X12 (BALLOONS) IMPLANT
CATH IMPULSE 5F ANG/FL3.5 (CATHETERS) ×1 IMPLANT
CATH LAUNCHER 5F JL3 (CATHETERS) IMPLANT
CATH LAUNCHER 5F RADL (CATHETERS) IMPLANT
CATH LAUNCHER 6FR AL.75 (CATHETERS) ×1 IMPLANT
CATH LAUNCHER 6FR EBU 3.5 SH (CATHETERS) ×1 IMPLANT
CATH OPTICROSS 40MHZ (CATHETERS) ×1 IMPLANT
CATH VISTA GUIDE 6FR JL3.5 SH (CATHETERS) ×1 IMPLANT
CATH VISTA GUIDE 6FR XBLAD3.0 (CATHETERS) ×1 IMPLANT
CATH VISTA GUIDE 6FRXBLAD3.0SH (CATHETERS) ×1 IMPLANT
CATHETER LAUNCHER 5F JL3 (CATHETERS) ×2
CATHETER LAUNCHER 5F RADL (CATHETERS) ×2
CATHETER LAUNCHER 6FR MP1 SH (CATHETERS) ×1 IMPLANT
DEVICE RAD COMP TR BAND LRG (VASCULAR PRODUCTS) ×1 IMPLANT
GLIDESHEATH SLEND SS 6F .021 (SHEATH) ×1 IMPLANT
GUIDEWIRE INQWIRE 1.5J.035X260 (WIRE) IMPLANT
INQWIRE 1.5J .035X260CM (WIRE) ×2
KIT ENCORE 26 ADVANTAGE (KITS) ×2 IMPLANT
KIT HEART LEFT (KITS) ×2 IMPLANT
PACK CARDIAC CATHETERIZATION (CUSTOM PROCEDURE TRAY) ×2 IMPLANT
SHEATH PINNACLE 6F 10CM (SHEATH) ×1 IMPLANT
SLED PULL BACK IVUS (MISCELLANEOUS) ×1 IMPLANT
TRANSDUCER W/STOPCOCK (MISCELLANEOUS) ×2 IMPLANT
TUBING CIL FLEX 10 FLL-RA (TUBING) ×2 IMPLANT
WIRE ASAHI PROWATER 180CM (WIRE) ×1 IMPLANT
WIRE EMERALD 3MM-J .035X150CM (WIRE) ×1 IMPLANT
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
## 2017-02-11 NOTE — Progress Notes (Signed)
Right femoral sheath pulled at 1750, held pressure for 20 minutes. Vitals remained stable, see flowsheet. Pt tolerated well. No hematoma noted at site. Pedal pulse +2 and warm. Educated pt about post sheath removal instructions.

## 2017-02-11 NOTE — Progress Notes (Signed)
Responded to a page for a Code Stemi.  Once the medical team assessed her I was able to get the number for her daughter.  Contacted daughter to let her know.  Will follow patient as needed. Chaplain Agustin CreeNewton Emit Kuenzel

## 2017-02-11 NOTE — Progress Notes (Signed)
   02/11/17 1700  Clinical Encounter Type  Visited With Patient  Visit Type Follow-up  Spiritual Encounters  Spiritual Needs Emotional;Prayer  Stress Factors  Patient Stress Factors Health changes   Followed up with patient following her Cathlab.  Patient was appreciative that I had contacted her daughter.  We spoke a little about her faith life and we prayed together.  Will follow as needed. Chaplain Agustin CreeNewton Lesly Pontarelli

## 2017-02-11 NOTE — ED Provider Notes (Signed)
[facility] CARDIAC CATH LAB Provider Note   CSN: [id number] Arrival date & time: 02/11/17  1213     History   Chief Complaint Chief Complaint  Patient presents with  . Code STEMI    HPI Julie Simpson is a 51 y.o. female history of hypertension, smoker, who presented with chest pain, shortness of breath.  Patient states that she had intermittent chest pain over the last month or so.  Patient states that it is worse when she exerts herself.  Over the last 3 days ago worse even at rest.  Since midnight last night, patient had constant chest pressure that does not go away.  She called EMS around 11:30 AM this morning.  EMS got there and she was noted to be hypertensive and EKG showed STEMI V2, V3. Code STEMI activated by EMS. Dr. Swaziland at bedside.  Prior cardiac history but does have family history of CAD. Patient does smoke cigarettes and drinks alcohol and does marijuana occasionally. Given ASA 325 mg, nitro x 2, morphine by EMS.   The history is provided by the patient.    Past Medical History:  Diagnosis Date  . Bicipital tenosynovitis   . Calcifying tendinitis of shoulder   . Cervical spondylosis without myelopathy   . 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 Personality change due to conditions classified elsewhere     Patient Active Problem List   Diagnosis Date Noted  . Protein-calorie malnutrition, severe (HCC) 12/23/2013  . Goiter 09/24/2012  . Depression 05/28/2012  . Knee osteoarthritis 11/01/2011  . Patellar tendinitis (bilateral) 08/02/2011  . Greater trochanteric bursitis right 08/02/2011  . 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 Chronic pain 08/02/2011    Past Surgical History:  Procedure Laterality Date  . COLON SURGERY     polypectomy  . HERNIA REPAIR    . TUBAL LIGATION      OB History    No data available       Home  Medications    Prior to Admission medications   Medication Sig Start Date End Date Taking? Authorizing Provider  ALPRAZolam Prudy Feeler) 1 MG tablet Take 1 mg by mouth 3 (three) times daily.  02/02/12   [provider]  baclofen (LIORESAL) 10 MG tablet Take 1 tablet (10 mg total) by mouth 3 (three) times daily as needed for muscle spasms. 01/11/15   Ranelle Oyster, MD  diclofenac sodium (VOLTAREN) 1 % GEL Apply 1 application topically 3 (three) times daily. Bilateral knees 12/14/14   Ranelle Oyster, MD  diltiazem (CARDIZEM CD) 120 MG 24 hr capsule Take 1 capsule (120 mg total) by mouth daily. 01/11/15   Ranelle Oyster, MD  furosemide (LASIX) 20 MG tablet Take 1 tablet (20 mg total) by mouth daily. 01/11/15   Ranelle Oyster, MD  lisinopril (PRINIVIL,ZESTRIL) 10 MG tablet Take 1 tablet (10 mg total) by mouth daily. 01/11/15   Ranelle Oyster, MD  omeprazole (PRILOSEC) 40 MG capsule Take 1 capsule (40 mg total) by mouth daily. 01/11/15   Ranelle Oyster, MD  propranolol (INDERAL) 40 MG tablet Take 1 tablet (40 mg total) by mouth 3 (three) times daily. 01/11/15   Ranelle Oyster, MD  sertraline (ZOLOFT) 50 MG tablet Take 1 tablet (50 mg total) by mouth daily. 01/11/15   Ranelle Oyster, MD  traMADol (ULTRAM) 50 MG tablet Take 1 tablet (50 mg total)  by mouth daily as needed. 12/14/14   Ranelle OysterSwartz, Zachary T, MD  traZODone (DESYREL) 100 MG tablet TAKE ONE TABLET AT BEDTIME 06/23/16   Ranelle OysterSwartz, Zachary T, MD    Family History Family History  Problem Relation Age of Onset  . Cancer Mother 6357       brain tumor  . Diabetes Father     Social History Social History  Substance Use Topics  . Smoking status: Current Every Day Smoker    Packs/day: 1.00  . Smokeless tobacco: Never Used  . Alcohol use Not on file     Allergies   Penicillins and Codeine   Review of Systems Review of Systems  Cardiovascular: Positive for chest pain.  All other systems reviewed and are  negative.    Physical Exam Updated Vital Signs BP (!) 161/107   Pulse (!) 124   Resp (!) 26   SpO2 99%   Physical Exam  Constitutional: She is oriented to person, place, and time.  Uncomfortable, holding her chest   HENT:  Head: Normocephalic.  MM dry   Eyes: Pupils are equal, round, and reactive to light. Conjunctivae and EOM are normal.  Neck: Normal range of motion. Neck supple.  Cardiovascular:  Tachycardic   Pulmonary/Chest: Effort normal and breath sounds normal.  Diminished bilateral bases   Abdominal: Soft. Bowel sounds are normal. She exhibits no distension.  Musculoskeletal: Normal range of motion.  Good peripheral pulses   Neurological: She is alert and oriented to person, place, and time.  Skin: Skin is warm.  Psychiatric: She has a normal mood and affect.  Nursing note and vitals reviewed.    ED Treatments / Results  Labs (all labs ordered are listed, but only abnormal results are displayed) Labs Reviewed  CBC WITH DIFFERENTIAL/PLATELET  COMPREHENSIVE METABOLIC PANEL  PROTIME-INR  I-STAT TROPONIN, ED    EKG  EKG Interpretation  Date/Time:  Sunday February 11 2017 12:18:39 EDT Ventricular Rate:  123 PR Interval:    QRS Duration: 73 QT Interval:  295 QTC Calculation: 422 R Axis:   -34 Text Interpretation:  Sinus tachycardia Ventricular premature complex Aberrant conduction of SV complex(es) Probable left atrial enlargement Left axis deviation Low voltage, extremity and precordial leads Borderline ST depression, diffuse leads Borderline ST elevation, anterior leads Baseline wander in lead(s) V2 + STEMI V2, V3 with reciprocal changes  Confirmed by Richardean CanalYao, David H [phone]([id number]) on 02/11/2017 12:20:44 PM       Radiology No results found.  Procedures Procedures (including critical care time)  CRITICAL CARE Performed by: Richardean Canalavid H Yao   Total critical care time: 30 minutes  Critical care time was exclusive of separately billable procedures and treating  other patients.  Critical care was necessary to treat or prevent imminent or life-threatening deterioration.  Critical care was time spent personally by me on the following activities: development of treatment plan with patient and/or surrogate as well as nursing, discussions with consultants, evaluation of patient's response to treatment, examination of patient, obtaining history from patient or surrogate, ordering and performing treatments and interventions, ordering and review of laboratory studies, ordering and review of radiographic studies, pulse oximetry and re-evaluation of patient's condition.   Medications Ordered in ED Medications  nitroGLYCERIN 50 mg in dextrose 5 % 250 mL (0.2 mg/mL) infusion ( Intravenous Automatically Held 02/11/17 1230)  nitroGLYCERIN (NITROSTAT) SL tablet 0.4 mg ( Sublingual MAR Hold 02/11/17 1225)  heparin bolus via infusion 4,000 Units (4,000 Units Intravenous Bolus from Bag 02/11/17 1222)  sodium  chloride 0.9 % bolus 1,000 mL (1,000 mLs Intravenous New Bag/Given 02/11/17 1221)     Initial Impression / Assessment and Plan / ED Course  I have reviewed the triage vital signs and the nursing notes.  Pertinent labs & imaging results that were available during my care of the patient were reviewed by me and considered in my medical decision making (see chart for details).     Julie Simpson is a 51 y.o. female here with chest pain. She has pain for the last month, worsening constant pain since yesterday. EMS EKG showed STEMI V2, V3. EKG in the ED also showed STEMI with Q waves so I am concerned that she may have had an MI over the last month. Dr. Swaziland at bedside and will take patient emergently to the cath lab. Given ASA, nitro by EMS. Given heparin bolus in the ED.    Final Clinical Impressions(s) / ED Diagnoses   Final diagnoses:  MI, acute, non ST segment elevation [facility])    New Prescriptions Current Discharge Medication List       Charlynne Pander, MD 02/11/17 1236

## 2017-02-11 NOTE — ED Triage Notes (Signed)
[facility] a coder stemi, pt been having chets pain for 1 month, last night pain began more serve crushing and felt weakness. Opt has had 6mg  morphine, 2 SL nitro tablets, 324 ASA.

## 2017-02-11 NOTE — Progress Notes (Signed)
CRITICAL VALUE ALERT  Critical Value:  Troponin 54.47  Date & Time Notied:  02/11/17  Provider Notified: Cards MD notified  Orders Received/Actions taken: n/a

## 2017-02-12 ENCOUNTER — Encounter (HOSPITAL_COMMUNITY): Payer: Self-pay | Admitting: Cardiology

## 2017-02-12 ENCOUNTER — Inpatient Hospital Stay (HOSPITAL_COMMUNITY): Payer: Medicare Other

## 2017-02-12 DIAGNOSIS — I214 Non-ST elevation (NSTEMI) myocardial infarction: Secondary | ICD-10-CM

## 2017-02-12 DIAGNOSIS — R072 Precordial pain: Secondary | ICD-10-CM

## 2017-02-12 DIAGNOSIS — I10 Essential (primary) hypertension: Secondary | ICD-10-CM

## 2017-02-12 DIAGNOSIS — F321 Major depressive disorder, single episode, moderate: Secondary | ICD-10-CM

## 2017-02-12 DIAGNOSIS — Z720 Tobacco use: Secondary | ICD-10-CM

## 2017-02-12 DIAGNOSIS — I2102 ST elevation (STEMI) myocardial infarction involving left anterior descending coronary artery: Secondary | ICD-10-CM

## 2017-02-12 DIAGNOSIS — I5041 Acute combined systolic (congestive) and diastolic (congestive) heart failure: Secondary | ICD-10-CM

## 2017-02-12 DIAGNOSIS — I2510 Atherosclerotic heart disease of native coronary artery without angina pectoris: Secondary | ICD-10-CM

## 2017-02-12 DIAGNOSIS — E785 Hyperlipidemia, unspecified: Secondary | ICD-10-CM

## 2017-02-12 HISTORY — DX: Tobacco use: Z72.0

## 2017-02-12 HISTORY — DX: Hyperlipidemia, unspecified: E78.5

## 2017-02-12 LAB — POCT I-STAT, CHEM 8
BUN: 6 mg/dL (ref 6–20)
CHLORIDE: 103 mmol/L (ref 101–111)
CREATININE: 0.5 mg/dL (ref 0.44–1.00)
Calcium, Ion: 1.1 mmol/L — ABNORMAL LOW (ref 1.15–1.40)
Glucose, Bld: 127 mg/dL — ABNORMAL HIGH (ref 65–99)
HEMATOCRIT: 38 % (ref 36.0–46.0)
Hemoglobin: 12.9 g/dL (ref 12.0–15.0)
Potassium: 3.1 mmol/L — ABNORMAL LOW (ref 3.5–5.1)
SODIUM: 141 mmol/L (ref 135–145)
TCO2: 25 mmol/L (ref 22–32)

## 2017-02-12 LAB — BASIC METABOLIC PANEL
ANION GAP: 10 (ref 5–15)
BUN: 5 mg/dL — ABNORMAL LOW (ref 6–20)
CALCIUM: 8.7 mg/dL — AB (ref 8.9–10.3)
CO2: 29 mmol/L (ref 22–32)
CREATININE: 0.69 mg/dL (ref 0.44–1.00)
Chloride: 96 mmol/L — ABNORMAL LOW (ref 101–111)
GFR calc non Af Amer: >60 mL/min (ref >60)
Glucose, Bld: 123 mg/dL — ABNORMAL HIGH (ref 65–99)
Potassium: 3.7 mmol/L (ref 3.5–5.1)
SODIUM: 135 mmol/L (ref 135–145)

## 2017-02-12 LAB — ECHOCARDIOGRAM COMPLETE
Height: 58 in
Weight: 1781.32 oz

## 2017-02-12 LAB — CBC
HCT: 38 % (ref 36.0–46.0)
HEMOGLOBIN: 12.4 g/dL (ref 12.0–15.0)
MCH: 30.2 pg (ref 26.0–34.0)
MCHC: 32.6 g/dL (ref 30.0–36.0)
MCV: 92.5 fL (ref 78.0–100.0)
PLATELETS: 168 10*3/uL (ref 150–400)
RBC: 4.11 MIL/uL (ref 3.87–5.11)
RDW: 13.7 % (ref 11.5–15.5)
WBC: 11.3 10*3/uL — AB (ref 4.0–10.5)

## 2017-02-12 LAB — TROPONIN I: Troponin I: >65 ng/mL (ref <0.03)

## 2017-02-12 LAB — POCT ACTIVATED CLOTTING TIME: Activated Clotting Time: 378 seconds

## 2017-02-12 MED ORDER — CARVEDILOL 3.125 MG PO TABS
3.1250 mg | ORAL_TABLET | Freq: Two times a day (BID) | ORAL | Status: DC
  Administered 2017-02-13 – 2017-02-17 (×9): 3.125 mg via ORAL
  Filled 2017-02-12 (×9): qty 1

## 2017-02-12 MED ORDER — ALBUTEROL SULFATE (2.5 MG/3ML) 0.083% IN NEBU: 2.5000 mg | INHALATION_SOLUTION | RESPIRATORY_TRACT | Status: DC | PRN

## 2017-02-12 MED ORDER — PERFLUTREN LIPID MICROSPHERE
1.0000 mL | INTRAVENOUS | Status: AC | PRN
  Administered 2017-02-12: 2 mL via INTRAVENOUS
  Filled 2017-02-12: qty 10

## 2017-02-12 MED ORDER — PERFLUTREN LIPID MICROSPHERE
INTRAVENOUS | Status: AC
  Filled 2017-02-12: qty 10

## 2017-02-12 MED ORDER — FUROSEMIDE 10 MG/ML IJ SOLN
20.0000 mg | Freq: Once | INTRAMUSCULAR | Status: AC
  Administered 2017-02-12: 20 mg via INTRAVENOUS
  Filled 2017-02-12: qty 2

## 2017-02-12 MED ORDER — ENOXAPARIN SODIUM 60 MG/0.6ML ~~LOC~~ SOLN
1.0000 mg/kg | Freq: Two times a day (BID) | SUBCUTANEOUS | Status: AC
  Administered 2017-02-12 – 2017-02-14 (×5): 50 mg via SUBCUTANEOUS
  Filled 2017-02-12 (×5): qty 0.5

## 2017-02-12 MED FILL — Ticagrelor Tab 90 MG: ORAL | Qty: 2 | Status: AC

## 2017-02-12 NOTE — Progress Notes (Signed)
  Echocardiogram 2D Echocardiogram has been performed.  Janalyn HarderWest, Dauntae Derusha R 02/12/2017, 10:55 AM

## 2017-02-12 NOTE — Progress Notes (Signed)
EKG CRITICAL VALUE     12 lead EKG performed.  Critical value noted.  Devonne DoughtyShanna, RN notified.   Guadalupe DawnMarilyn R Jaelyn Cloninger, CCT 02/12/2017 6:41 AM

## 2017-02-12 NOTE — Progress Notes (Signed)
CARDIAC REHAB PHASE I   Spoke with RN, pt with ongoing chest pain, awaiting TCTS consult, will hold ambulation today, will follow up tomorrow.   Joylene GrapesEmily C Daevon Holdren, RN, BSN 02/12/2017 2:09 PM

## 2017-02-12 NOTE — Progress Notes (Signed)
Visited with patient and provided education about Advanced Directive.  Patient would like to talk with daughter and sister in law about both being HCPOA and then she will work towards getting document completed.  Please let Spiritual Care Department know once she is ready for notary and we will work with her in getting it done.    Spiritual Support and active listening provided.    02/12/17 [id number]  Clinical Encounter Type  Visited With Patient  Visit Type Initial;Spiritual support

## 2017-02-12 NOTE — Progress Notes (Signed)
Progress Note  Patient Name: Julie SarnaLeisa Brooking Date of Encounter: 02/12/2017  Primary Cardiologist: [place]  Subjective   She is lying flat in bed - but notes intermittent L sided CP.   Intermittent dyspnea  Inpatient Medications    Scheduled Meds: . ALPRAZolam  1 mg Oral TID  . aspirin  81 mg Oral Daily  . atorvastatin  80 mg Oral q1800  . [START ON 02/13/2017] carvedilol  3.125 mg Oral BID WC  . enoxaparin (LOVENOX) injection  1 mg/kg Subcutaneous Q12H  . Influenza vac split quadrivalent PF  0.5 mL Intramuscular Tomorrow-1000  . perflutren lipid microspheres (DEFINITY) IV suspension      . pneumococcal 23 valent vaccine  0.5 mL Intramuscular Tomorrow-1000  . potassium chloride  40 mEq Oral BID  . sertraline  50 mg Oral Daily  . sodium chloride flush  3 mL Intravenous Q12H  . traZODone  100 mg Oral QHS   Continuous Infusions: . sodium chloride Stopped (02/11/17 2000)   PRN Meds: sodium chloride, acetaminophen, albuterol, alum & mag hydroxide-simeth, baclofen, nitroGLYCERIN, ondansetron (ZOFRAN) IV, sodium chloride flush   Vital Signs    Vitals:   02/12/17 1100 02/12/17 1200 02/12/17 1201 02/12/17 1300  BP: 92/68 (!) 96/56 (!) 96/56 103/73  Pulse:   (!) 107   Resp: 20 16 (!) 21 17  Temp:   99 F (37.2 C)   TempSrc:   Oral   SpO2: 99% 99% 99% 99%  Weight:      Height:        Intake/Output Summary (Last 24 hours) at 02/12/17 1415 Last data filed at 02/12/17 0800  Gross per 24 hour  Intake             1130 ml  Output             1600 ml  Net             -470 ml   Filed Weights   02/11/17 1500  Weight: 111 lb 5.3 oz (50.5 kg)    Telemetry    Sinus tachycardia, rates in the 90s to 100s. No true arrhythmias. - Personally Reviewed  ECG    S Tachy 106;  Left anterior fascicular block Anteroseptal infarct , possibly acute (evolutrionary changes) T wave abnormality, consider lateral ischemia - Personally Reviewed  Physical Exam   Physical Exam    Constitutional: She is oriented to person, place, and time. She appears distressed (No acute distress, does not look comfortable).  Thin & frail. Weak appearing. Chronically ill, malnourished appearing  HENT:  Head: Normocephalic and atraumatic.  Mouth/Throat: No oropharyngeal exudate.  Neck: Normal range of motion. Neck supple. No hepatojugular reflux and no JVD present. Carotid bruit is not present.  Cardiovascular: Regular rhythm and normal pulses.   Occasional extrasystoles are present. Tachycardia present.  PMI is not displaced.   No murmur heard. Very difficult to determine if there is a gallop with the tachycardia, but does appear that there is at least an S4 gallop noted. Normal S1 and S2.  Pulmonary/Chest: Effort normal. She exhibits tenderness.  Diffuse extra wheezing with rhonchorous breath sounds throughout. No obvious rales noted. Nonlabored breathing.  Abdominal: Soft. Bowel sounds are normal. She exhibits no distension. There is no tenderness. There is no rebound.  Scaphoid abdomen  Musculoskeletal: Normal range of motion. She exhibits no edema or deformity.  Neurological: She is alert and oriented to person, place, and time.  Skin: Skin is warm and dry. No  erythema.  Psychiatric:  Very labile affect. She has very poor short-term memory and asks the same question several times over and over again. Likely related to her traumatic brain injury.  Nursing note and vitals reviewed.   Labs    Chemistry  Recent Labs Lab 02/11/17 1219 02/11/17 1234 02/11/17 1600 02/12/17 0159  NA 139 142  --  135  K 2.9* 3.1*  --  3.7  CL 101 101  --  96*  CO2 25  --   --  29  GLUCOSE 117* 125*  --  123*  BUN 6 6  --  5*  CREATININE 0.82 0.70 0.64 0.69  CALCIUM 8.7*  --   --  8.7*  PROT 6.8  --   --   --   ALBUMIN 3.9  --   --   --   AST 131*  --   --   --   ALT 35  --   --   --   ALKPHOS 79  --   --   --   BILITOT 0.7  --   --   --   GFRNONAA >60  --  >60 >60  GFRAA >60  --   >60 >60  ANIONGAP 13  --   --  10     Hematology  Recent Labs Lab 02/11/17 1219 02/11/17 1234 02/12/17 0159  WBC 14.2*  --  11.3*  RBC 4.39  --  4.11  HGB 13.9 14.6 12.4  HCT 40.1 43.0 38.0  MCV 91.3  --  92.5  MCH 31.7  --  30.2  MCHC 34.7  --  32.6  RDW 13.7  --  13.7  PLT 200  --  168    Cardiac Enzymes  Recent Labs Lab 02/11/17 1600 02/12/17 0159  TROPONINI 54.47* >65.00*     Recent Labs Lab 02/11/17 1228  TROPIPOC 6.89*     BNPNo results for input(s): BNP, PROBNP in the last 168 hours.   DDimer No results for input(s): DDIMER in the last 168 hours.   Radiology    Dg Chest Port 1 View  Result Date: 02/11/2017 CLINICAL DATA:  STEMI.  Everyday smoker. EXAM: PORTABLE CHEST 1 VIEW COMPARISON:  05/31/2006 FINDINGS: Numerous leads and wires project over the chest. Midline trachea. Mild cardiomegaly. No pleural effusion or pneumothorax. Diffuse peribronchial thickening. No congestive failure. Mild scarring or subsegmental atelectasis in the left lower lobe laterally. Mild hyperinflation. IMPRESSION: Cardiomegaly without congestive failure. Hyperinflation and mild interstitial thickening, suggesting COPD/ chronic bronchitis. Electronically Signed   By: Jeronimo Greaves M.D.   On: 02/11/2017 15:29    Cardiac Studies   Procedures   CORONARY BALLOON ANGIOPLASTY  Intravascular Ultrasound/IVUS  LEFT HEART CATH AND CORONARY ANGIOGRAPHY  Conclusion     Ost LM lesion, 80 %stenosed.  2nd Mrg lesion, 85 %stenosed.  Mid Cx to Dist Cx lesion, 75 %stenosed.  LV end diastolic pressure is severely elevated. - EDP 37 mmHg  Prox LAD lesion, 100 %stenosed.  Post intervention, there is a 20% residual stenosis.  Prox RCA to Mid RCA lesion, 60 %stenosed.   1. Ostial left main stenosis - 80% 2. 3 vessel CAD    - 100% proximal LAD.     - 85% OM2, and 75 % distal dominant LCx    - 60% nondominant RCA 3. Severely elevated LVEDP 4. Successful balloon angioplasty of the  proximal LAD. Stent not placed due to poor outflow. I suspect this vessel has been occluded for >  12-24 hours.  5. Significant left main stenosis by IVUS criteria.  Plan: optimize medical management. Assess LV function by Echo. Will need to consider revascularization with CABG depending on clinical course. Given this consideration and the fact that she did not receive a stent we did not initiate P2 Y12 inhibitor.          2D Echo: EF 20%; Mild LVH. Akinesis of the anteroseptal wall and the basal to apical inferoseptal wall. Mid to apical  inferior akinesis and mid to apical anterior akinesis. Akinesis of the true apex. Apical lateral akinesis -- Multiple wall motion abnormalities suggesting extensive infarction in the territory of a large LAD.Marland Kitchen Doppler parameters are consistent with abnormal left ventricular relaxation (grade 1 diastolic dysfunction).  The RV was normal in size with mildly decreased systolic function (akinesis of the RV apex noted).   Patient Profile     51 y.o. female  no prior cardiac history but risk factors including tobacco use and HTN, presented as a CODE STEMI with what appears to have been delayed presentation for Anterior MI --> Cath: 100% LAD - PTCA to 20%; also LM 80%, Cx 75%, OM1 80%; EF 20% by Echo RWMA c/s large LAD infarct.  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 Dr. [place] was able to restore some flow but not much flow down the LAD. The entire LAD territory has diffuse hypokinesis on echocardiogram. This would suggest a likely subacute  presentation of her MI.  She now has severe ischemic cardiomyopathy with acute on chronic combined systolic and diastolic heart failure - with EF of 20% and large anterior wall motion abnormalities. Blood pressure will not allow [place] to put her back on ARB and/or beta blocker.   Would like to try to have her on low-dose carvedilol 3.125 mg twice a day but hold ARB.  She does have symptoms of orthopnea symptoms and dyspnea. We will give IV Lasix 20 mg daily.  With likely active infarct, we will treat with enoxaparin treatment dose for at least 48 hours.  Aspirin but no Plavix to the decision is made about possible CABG  I have consulted CT surgery, with the understanding that she may not be a great bypass candidate because of her comorbidities, malnutrition and what may very well be a completed anterior infarct with no viability in the anterior wall. May need to consider a viability study to evaluate for LAD flow.  High-dose statin  Agree anxiolytic. Continue Zoloft. Monitor QT interval.  Does have diagnosis of COPD, with long-term smoker with diffuse coarse breath sounds. Will put on nebulizers when necessary.  For now I will treat MI with subcutaneous heparin. I have consulted CT surgery to evaluate the patient while we are treating supportively. The decision needs to be made whether or not she is a CABG candidate. If not then we need to determine the appropriateness of considering left main and circumflex PCI plus or minus attempted PCI of the LAD.  If nothing else, she will need like this on discharge unless her EF improves. We can  consider rechecking echocardiogram several days.  The patient is very difficult to talk with, her daughter was very emotional and was not able to have a full conversation about her condition. After spending close to half an hour with the patient and her daughter, her niece then came and I had to go back to the entire scenario with her again. All told, with chart  review," and direct patient/family interaction I spent a total of 75 minutes on this patient alone. She is very complex with difficult coronary anatomy and I did discuss the anatomy with Dr. Katrinka Blazing interventional cardiology and will need to discuss with Dr. [place] as well. I then consulted CT surgery to discuss month all processes as well.   For questions or updates, please contact [facility] Please consult [email or web address] for contact info under Cardiology/STEMI.      Signed, Bryan Lemma, MD  02/12/2017, 2:15 PM

## 2017-02-12 NOTE — Care Management Note (Addendum)
Case Management Note  Patient Details  Name: Julie Simpson MRN: [id number] Date of Birth: [birth year]  Subjective/Objective:  From home alone, pta indep, presents with NSTEMI, s/p  Successful balloon angioplasty of the proximal LAD, also TCTS  consult  . She states her daughter Julie Simpson will be with here at discharge and also her neighbor across the hall from her will assist her .    10/24 1728 Letha Capeeborah Imonie Tuch RN, BSN - pt eval pending,  Ambulated 150 feet with cardiac rehab today.   Patient to designate Lawson RadarAngela Hudson as her POA per NCM referral.   PT eval rec SNF.             Action/Plan: NCM will follow for dc needs.   Expected Discharge Date:                  Expected Discharge Plan:  Home/Self Care  In-House Referral:     Discharge planning Services  CM Consult  Post Acute Care Choice:    Choice offered to:     DME Arranged:    DME Agency:     HH Arranged:    HH Agency:     Status of Service:  Completed, signed off  If discussed at [employer] Length of Stay Meetings, dates discussed:    Additional Comments:  Leone Havenaylor, Ralphine Hinks Clinton, RN 02/12/2017, 3:48 PM

## 2017-02-12 NOTE — Progress Notes (Signed)
1 Day Post-Op Procedure(s) (LRB): LEFT HEART CATH AND CORONARY ANGIOGRAPHY (N/A) Intravascular Ultrasound/IVUS (N/A) CORONARY BALLOON ANGIOPLASTY (N/A) Subjective: Patient examined, coronary angiogram and 2-D echocardiogram images personally reviewed and counseled with patient  51 year old female smoker presented with completed large anterior MI with troponin > 65.00. She has severe LV dysfunction with EF of 15-20 percent. She had LVEDP of 31 mmHg. Her LAD was occluded and partially reopened with PTCA. She has poor targets for grafting in the LAD and RCA distribution. Her circumflex is dominant and has mild disease. With her poor coronary targets ends extremely poor LV function she would not benefit from CABG.  The patient has chronic medical problems including previous craniotomy for traumatic head injury 8-10 years ago.  Objective: Vital signs in last 24 hours: Temp:  [98.4 F (36.9 C)-100 F (37.8 C)] 98.4 F (36.9 C) (10/22 1556) Pulse Rate:  [105-107] 107 (10/22 1201) Cardiac Rhythm: Sinus tachycardia (10/22 1710) Resp:  [16-33] 18 (10/22 1710) BP: (82-103)/(49-81) 89/49 (10/22 1710) SpO2:  [96 %-100 %] 96 % (10/22 1710)  Hemodynamic parameters for last 24 hours:  sinus rhythm  Intake/Output from previous day: 10/21 0701 - 10/22 0700 In: 1190 [P.O.:840; I.V.:350] Out: 1500 [Urine:1500] Intake/Output this shift: No intake/output data recorded.      Physical Exam  General: Thin, frail, and chronically ill-appearing anxious female no acute distress accompanied by daughter HEENT: Normocephalic pupils equal , dentition adequate Neck: Supple without JVD, adenopathy, or bruit Chest: Clear to auscultation, symmetrical breath sounds, no rhonchi, no tenderness             or deformity Cardiovascular: Regular rate and rhythm, no murmur, +S4 gallop, peripheral pulses             palpable in all extremities Abdomen:  Soft, nontender, no palpable mass or organomegaly Extremities:  Warm, well-perfused, no clubbing cyanosis edema or tenderness,              no venous stasis changes of the legs Rectal/GU: Deferred Neuro: Grossly non--focal and symmetrical throughout Skin: Clean and dry without rash or ulceration   Lab Results:  Recent Labs  02/11/17 1219  02/11/17 1241 02/12/17 0159  WBC 14.2*  --   --  11.3*  HGB 13.9  < > 12.9 12.4  HCT 40.1  < > 38.0 38.0  PLT 200  --   --  168  < > = values in this interval not displayed. BMET:  Recent Labs  02/11/17 1219  02/11/17 1241 02/11/17 1600 02/12/17 0159  NA 139  < > 141  --  135  K 2.9*  < > 3.1*  --  3.7  CL 101  < > 103  --  96*  CO2 25  --   --   --  29  GLUCOSE 117*  < > 127*  --  123*  BUN 6  < > 6  --  5*  CREATININE 0.82  < > 0.50 0.64 0.69  CALCIUM 8.7*  --   --   --  8.7*  < > = values in this interval not displayed.  PT/INR:  Recent Labs  02/11/17 1219  LABPROT 14.0  INR 1.09   ABG    Component Value Date/Time   TCO2 25 02/11/2017 1241   CBG (last 3)  No results for input(s): GLUCAP in the last 72 hours.  Assessment/Plan: S/P Procedure(s) (LRB): LEFT HEART CATH AND CORONARY ANGIOGRAPHY (N/A) Intravascular Ultrasound/IVUS (N/A) CORONARY BALLOON ANGIOPLASTY (N/A) The patient  presented with a completed large anterior wall MI, she has severe LV dysfunction and poor targets for grafting. She would not benefit from CABG. Recommend medical therapy versus further PCI   LOS: 1 day    Kathlee Nationseter Van Trigt III 02/12/2017

## 2017-02-13 DIAGNOSIS — I255 Ischemic cardiomyopathy: Secondary | ICD-10-CM

## 2017-02-13 LAB — URINALYSIS, ROUTINE W REFLEX MICROSCOPIC
BILIRUBIN URINE: NEGATIVE
GLUCOSE, UA: NEGATIVE mg/dL
KETONES UR: NEGATIVE mg/dL
Nitrite: NEGATIVE
PH: 5.5 (ref 5.0–8.0)
PROTEIN: NEGATIVE mg/dL
Specific Gravity, Urine: 1.02 (ref 1.005–1.030)

## 2017-02-13 LAB — URINALYSIS, MICROSCOPIC (REFLEX)

## 2017-02-13 MED ORDER — NITROGLYCERIN 0.4 MG SL SUBL
SUBLINGUAL_TABLET | SUBLINGUAL | Status: AC
  Administered 2017-02-13: 0.4 mg
  Filled 2017-02-13: qty 1

## 2017-02-13 MED ORDER — CLOPIDOGREL BISULFATE 300 MG PO TABS
300.0000 mg | ORAL_TABLET | Freq: Once | ORAL | Status: AC
  Administered 2017-02-13: 300 mg via ORAL
  Filled 2017-02-13: qty 1

## 2017-02-13 MED ORDER — LOSARTAN POTASSIUM 25 MG PO TABS
12.5000 mg | ORAL_TABLET | Freq: Every day | ORAL | Status: DC
  Administered 2017-02-13 – 2017-02-16 (×4): 12.5 mg via ORAL
  Filled 2017-02-13 (×4): qty 1

## 2017-02-13 MED ORDER — DIGOXIN 125 MCG PO TABS
0.1250 mg | ORAL_TABLET | Freq: Every day | ORAL | Status: DC
  Administered 2017-02-13 – 2017-02-17 (×5): 0.125 mg via ORAL
  Filled 2017-02-13 (×5): qty 1

## 2017-02-13 MED ORDER — POTASSIUM CHLORIDE CRYS ER 20 MEQ PO TBCR
40.0000 meq | EXTENDED_RELEASE_TABLET | Freq: Every day | ORAL | Status: DC
  Administered 2017-02-13: 40 meq via ORAL
  Filled 2017-02-13: qty 2

## 2017-02-13 MED ORDER — FUROSEMIDE 20 MG PO TABS
20.0000 mg | ORAL_TABLET | Freq: Every day | ORAL | Status: DC
  Administered 2017-02-13 – 2017-02-14 (×2): 20 mg via ORAL
  Filled 2017-02-13 (×2): qty 1

## 2017-02-13 MED ORDER — CLOPIDOGREL BISULFATE 75 MG PO TABS
75.0000 mg | ORAL_TABLET | Freq: Every day | ORAL | Status: DC
  Administered 2017-02-14 – 2017-02-17 (×4): 75 mg via ORAL
  Filled 2017-02-13 (×4): qty 1

## 2017-02-13 MED ORDER — SPIRONOLACTONE 25 MG PO TABS
12.5000 mg | ORAL_TABLET | Freq: Every day | ORAL | Status: DC
  Administered 2017-02-13 – 2017-02-17 (×4): 12.5 mg via ORAL
  Filled 2017-02-13 (×5): qty 1

## 2017-02-13 MED ORDER — ISOSORBIDE MONONITRATE ER 30 MG PO TB24
15.0000 mg | ORAL_TABLET | Freq: Every day | ORAL | Status: DC
  Administered 2017-02-13: 15 mg via ORAL
  Filled 2017-02-13: qty 1

## 2017-02-13 MED ORDER — DEXTROSE 5 % IV SOLN
1.0000 g | INTRAVENOUS | Status: AC
  Administered 2017-02-13 – 2017-02-15 (×3): 1 g via INTRAVENOUS
  Filled 2017-02-13 (×3): qty 10

## 2017-02-13 NOTE — Progress Notes (Signed)
Pt complained chest pain 6/10 after ambulating with cardiac rehab. Encouraged pt to rest and a nitro tab was given. Once given, pt stated pain is "still a 5 or 6." Obtained EKG and notified MD.

## 2017-02-13 NOTE — Progress Notes (Signed)
Pt with complaints of chest pain 7/10 on 1-10 pain scale. Oxygen via [place] and SL NTG administered per protocol. Chest pain decreased to 3-4/10. Vitals per flow sheet. Dr Shirlee LatchMclean notified. No orders received. Will continue to monitor.

## 2017-02-13 NOTE — Progress Notes (Signed)
  Paged with chest pain.   Pt describes it as 5-6 out of 10 chest pressure that has been near constant since admission. Occasionally radiates to R chest and is a 6-7 out of 10 pain.    This was slightly more pronounced after walking with cardiac rehab, but for the most part has been stable.    This is 2/2 to her known severe CAD and unlikely to improve.  May eventually require further intervention to her OM2 occlusion.   EKG unchanged from admission.  Start imdur 15 mg daily Load with 300 mg of Plavix, and start 75 mg daily tomorrow.   All above discussed with MD. No further changes at this time.   Casimiro NeedleMichael [address]Andy" Tolnaillery, PA-C 02/13/2017 1:55 PM
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
## 2017-02-14 LAB — BASIC METABOLIC PANEL
ANION GAP: 12 (ref 5–15)
BUN: 8 mg/dL (ref 6–20)
CO2: 26 mmol/L (ref 22–32)
Calcium: 9.2 mg/dL (ref 8.9–10.3)
Chloride: 94 mmol/L — ABNORMAL LOW (ref 101–111)
Creatinine, Ser: 0.68 mg/dL (ref 0.44–1.00)
GFR calc Af Amer: >60 mL/min (ref >60)
Glucose, Bld: 126 mg/dL — ABNORMAL HIGH (ref 65–99)
POTASSIUM: 4.4 mmol/L (ref 3.5–5.1)
SODIUM: 132 mmol/L — AB (ref 135–145)

## 2017-02-14 LAB — URINE CULTURE: Culture: NO GROWTH

## 2017-02-14 LAB — MAGNESIUM: MAGNESIUM: 1.7 mg/dL (ref 1.7–2.4)

## 2017-02-14 MED ORDER — ISOSORBIDE MONONITRATE ER 30 MG PO TB24
30.0000 mg | ORAL_TABLET | Freq: Every day | ORAL | Status: DC
  Administered 2017-02-14: 30 mg via ORAL
  Filled 2017-02-14: qty 1

## 2017-02-14 MED ORDER — DEXTROSE 5 % IV SOLN
3.0000 g | Freq: Once | INTRAVENOUS | Status: AC
  Administered 2017-02-14: 3 g via INTRAVENOUS
  Filled 2017-02-14: qty 6

## 2017-02-14 NOTE — Progress Notes (Signed)
CARDIAC REHAB PHASE I   PRE:  Rate/Rhythm: 100 SR  BP:  Supine: 95/66  Sitting:   Standing:    SaO2: 94%RA  MODE:  Ambulation: 150 ft   POST:  Rate/Rhythm: 107 ST  BP:  Supine: 88/63  Sitting:   Standing:    SaO2: 96%RA 1155-1222 Pt very sleepy but willing to walk. Pt walked 150 ft on RA with gait belt use, rolling walker and asst x 1. Pt did not c/o CP but she did c/o severe SOB and slight dizziness. Encouraged pursed lip breathing but she stated hard to do. Back to bed after walk so pt could sleep. BP lower than before walk. Daughter in room.    Julie Nuttingharlene Koi [name], RN BSN  02/14/2017 12:19 PM
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
## 2017-02-14 NOTE — Progress Notes (Signed)
Pt states," I would like for Julie Simpson [facility] because I trust her decision-making".  Julie RadarAngela Simpson, Brandi RaleighGlidwell (daughter) present and in agreement. Mrs. Wilson SingerHudson would like full update from physician and case management on pt condition and prognosis including [facility]  Documents.   Julie RadarAngela Simpson contact information: Home [phone] Cell    [phone]

## 2017-02-15 ENCOUNTER — Encounter (HOSPITAL_COMMUNITY): Payer: Self-pay | Admitting: *Deleted

## 2017-02-15 LAB — BASIC METABOLIC PANEL
ANION GAP: 11 (ref 5–15)
BUN: 10 mg/dL (ref 6–20)
CO2: 27 mmol/L (ref 22–32)
Calcium: 9.1 mg/dL (ref 8.9–10.3)
Chloride: 91 mmol/L — ABNORMAL LOW (ref 101–111)
Creatinine, Ser: 0.64 mg/dL (ref 0.44–1.00)
GFR calc Af Amer: >60 mL/min (ref >60)
GLUCOSE: 120 mg/dL — AB (ref 65–99)
POTASSIUM: 4.6 mmol/L (ref 3.5–5.1)
Sodium: 129 mmol/L — ABNORMAL LOW (ref 135–145)

## 2017-02-15 LAB — CBC
HCT: 39.4 % (ref 36.0–46.0)
HEMOGLOBIN: 13.1 g/dL (ref 12.0–15.0)
MCH: 30.3 pg (ref 26.0–34.0)
MCHC: 33.2 g/dL (ref 30.0–36.0)
MCV: 91.2 fL (ref 78.0–100.0)
Platelets: 191 10*3/uL (ref 150–400)
RBC: 4.32 MIL/uL (ref 3.87–5.11)
RDW: 13 % (ref 11.5–15.5)
WBC: 9.1 10*3/uL (ref 4.0–10.5)

## 2017-02-15 MED ORDER — ISOSORBIDE MONONITRATE ER 60 MG PO TB24
60.0000 mg | ORAL_TABLET | Freq: Every day | ORAL | Status: DC
  Administered 2017-02-15 – 2017-02-17 (×3): 60 mg via ORAL
  Filled 2017-02-15 (×3): qty 1

## 2017-02-15 MED ORDER — NICOTINE 21 MG/24HR TD PT24
21.0000 mg | MEDICATED_PATCH | Freq: Every day | TRANSDERMAL | Status: DC
  Administered 2017-02-15 – 2017-02-17 (×3): 21 mg via TRANSDERMAL
  Filled 2017-02-15 (×3): qty 1

## 2017-02-15 MED ORDER — ASPIRIN EC 81 MG PO TBEC
81.0000 mg | DELAYED_RELEASE_TABLET | Freq: Every day | ORAL | Status: DC
  Administered 2017-02-16 – 2017-02-17 (×2): 81 mg via ORAL
  Filled 2017-02-15 (×2): qty 1

## 2017-02-15 MED ORDER — IVABRADINE HCL 5 MG PO TABS
2.5000 mg | ORAL_TABLET | Freq: Two times a day (BID) | ORAL | Status: DC
  Administered 2017-02-15 – 2017-02-17 (×5): 2.5 mg via ORAL
  Filled 2017-02-15 (×7): qty 1

## 2017-02-15 MED ORDER — ENOXAPARIN SODIUM 30 MG/0.3ML ~~LOC~~ SOLN
30.0000 mg | SUBCUTANEOUS | Status: DC
  Administered 2017-02-15 – 2017-02-16 (×2): 30 mg via SUBCUTANEOUS
  Filled 2017-02-15 (×2): qty 0.3
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
## 2017-02-15 NOTE — Progress Notes (Signed)
Pt walked with PT and was assisted back to bed. Shortly after nurse tech alerted nurse that pt's room smelled like smoke. RN asked pt if she had been smoking and pt denied. RN then said she needed to check the pts socks again; RN found lighter and old lit cigarette butt in pts sock. Pt then admitted to RN that she had smoked in the room and it was difficult for her to quite cold [place]. RN took lighter and cigarettes out of pt's room to be held in Assist Director's office Ellwood Handler(Lisa Yow). MD made aware and nicotine patch ordered for pt. Pt educated again about the importance of not smoking and that it is against hospital policy to be smoking on the campus. RN also informed pt that if her family members were to bring her cigarettes we would have to restricted visitation.

## 2017-02-15 NOTE — Progress Notes (Signed)
Pt called out asking if she could be walked to the bathroom [facility] RN agreed to assist pt and upon helping pt adjust socks before walking RN found lighter and cigarette in pt's socks. Pt stated to RN "These are my daughters she left them in the room". RN took items away to store in personal belongs bag until family can take home. When RN opened pt's bags a pack of cigarettes was inside. RN educated pt about the importance of not smoking and that her family needs to take items home today. Pt open to education. Heart Failure team notified. Will continue to monitor pt.

## 2017-02-15 NOTE — Progress Notes (Signed)
CARDIAC REHAB PHASE I   PRE:  Rate/Rhythm: 102 ST    BP: sitting 102/64    SaO2: 98 RA  MODE:  Ambulation: 150  ft   POST:  Rate/Rhythm: 112 ST    BP: sitting 130/68     SaO2: 99 RA  Helped pt to beside toilet before walk. Pt walked 150 ft with no c/o of CP. Pt did report legs feeling "so tired" during walk. Pt was returned to recliner. Pt wanted to return to bed, but was encouraged to sit in recliner as much as she could tolerate it. Pt was helped set up her lunch. Pt was reminded about low sodium intake when she asked for salt to season lunch.   [id number]  Julie MassonRandi Kristan Bulah Simpson CES, ACSM 02/15/2017 2:05 PM

## 2017-02-16 LAB — BASIC METABOLIC PANEL
ANION GAP: 11 (ref 5–15)
BUN: 16 mg/dL (ref 6–20)
CHLORIDE: 90 mmol/L — AB (ref 101–111)
CO2: 26 mmol/L (ref 22–32)
Calcium: 9 mg/dL (ref 8.9–10.3)
Creatinine, Ser: 0.73 mg/dL (ref 0.44–1.00)
GFR calc Af Amer: >60 mL/min (ref >60)
GFR calc non Af Amer: >60 mL/min (ref >60)
GLUCOSE: 125 mg/dL — AB (ref 65–99)
POTASSIUM: 4.3 mmol/L (ref 3.5–5.1)
Sodium: 127 mmol/L — ABNORMAL LOW (ref 135–145)

## 2017-02-16 LAB — MAGNESIUM: Magnesium: 2 mg/dL (ref 1.7–2.4)
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
## 2017-02-16 NOTE — Progress Notes (Signed)
Patient and pt daughters agreeable to placement at [facility] - they can accept patient over the weekend if stable  CSW will continue to follow  Burna SisJenna H. Marteze Vecchio, LCSW Clinical Social Worker [phone]
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
## 2017-02-16 NOTE — Progress Notes (Signed)
Patient with complaints of intermittent chest pain, vital stable, obtaining EKG now, notifying MD on call Stanford BreedBracey, Oluwatobiloba Martin N RN 2:49 PM 02-16-2017

## 2017-02-16 NOTE — Progress Notes (Signed)
Spoke with on call Mardelle Mattendy, no new orders for chest pain, stated to notify if it intensifies or worsens, patient is currently stable Julie Simpson, Mukhtar Shams N RN 3:01 PM

## 2017-02-16 NOTE — Progress Notes (Signed)
Pt transferred to 3E29 via wheelchair on tele monitoring. Pt tolerated transfer well and VSS. Pt received by NT and assisted to bed in new room.

## 2017-02-16 NOTE — NC FL2 (Signed)
[place] MEDICAID FL2 LEVEL OF CARE SCREENING TOOL     IDENTIFICATION  Patient Name: Julie SarnaLeisa Gullatt Birthdate: 09-16-1965 Sex: female Admission Date (Current Location): 02/11/2017  [facility] [place]  [place]   Facility and Address:  [facility], [address], [place], [place] [address]      Provider Number: [id number]  Attending Physician Name and Address:  SwazilandJordan, Peter M, MD  Relative Name and Phone Number:       Current Level of Care: Hospital Recommended Level of Care: Skilled Nursing Facility Prior Approval Number:    Date Approved/Denied:   PASRR Number: [id number]  Discharge Plan: SNF    Current Diagnoses: Patient Active Problem List   Diagnosis Date Noted  . Hyperlipidemia with target LDL less than 70 02/12/2017  . Tobacco use 02/12/2017  . Acute combined systolic and diastolic heart failure (HCC) 02/12/2017  . Essential hypertension   . Acute ST elevation myocardial infarction (STEMI) involving LAD: 02/11/2017    Class: Hospitalized for  . Left main coronary artery disease: 80% stenosis confirmed by IVUS; 02/11/2017  . Protein-calorie malnutrition, severe (HCC) 12/23/2013  . Goiter 09/24/2012  . Depression 05/28/2012  . Knee osteoarthritis 11/01/2011  . Patellar tendinitis (bilateral) 08/02/2011  . Greater trochanteric bursitis right 08/02/2011  . Traumatic brain injury (HCC) - h/o 08/02/2011  . Chronic pain 08/02/2011    Orientation RESPIRATION BLADDER Height & Weight     Self, Time, Situation, Place  Normal Continent Weight: 105 lb 13.1 oz (48 kg) Height:  4\' 10"  (147.3 cm)  BEHAVIORAL SYMPTOMS/MOOD NEUROLOGICAL BOWEL NUTRITION STATUS      Continent Diet (cardiac)  AMBULATORY STATUS COMMUNICATION OF NEEDS Skin   Limited Assist Verbally Normal                       Personal Care Assistance Level of Assistance  Bathing, Dressing Bathing Assistance: Limited assistance   Dressing Assistance: Limited  assistance     Functional Limitations Info             SPECIAL CARE FACTORS FREQUENCY  PT (By licensed PT), OT (By licensed OT)     PT Frequency: 5/wk OT Frequency: 5/wk            Contractures      Additional Factors Info  Code Status, Allergies, Psychotropic Code Status Info: FULL Allergies Info: Codeine, Penicillins Psychotropic Info: zoloft, xanax         Current Medications (02/16/2017):  This is the current hospital active medication list Current Facility-Administered Medications  Medication Dose Route Frequency Provider Last Rate Last Dose  . 0.9 %  sodium chloride infusion  250 mL Intravenous PRN [place]Jordan, Peter M, MD   Stopped at 02/11/17 2000  . acetaminophen (TYLENOL) tablet 650 mg  650 mg Oral Q4H PRN [place]Jordan, Peter M, MD   650 mg at 02/15/17 1957  . albuterol (PROVENTIL) (2.5 MG/3ML) 0.083% nebulizer solution 2.5 mg  2.5 mg Nebulization Q4H PRN Marykay LexHarding, David W, MD      . ALPRAZolam Prudy Feeler(XANAX) tablet 1 mg  1 mg Oral TID [place]Jordan, Peter M, MD   1 mg at 02/16/17 [id number]  . alum & mag hydroxide-simeth (MAALOX/MYLANTA) 200-200-20 MG/5ML suspension 30 mL  30 mL Oral Q6H PRN [place]Jordan, Peter M, MD   30 mL at 02/16/17 0900  . aspirin EC tablet 81 mg  81 mg Oral Daily [place]Jordan, Peter M, MD   81 mg at 02/16/17 [id number]  .  atorvastatin (LIPITOR) tablet 80 mg  80 mg Oral q1800 [place], Peter M, MD   80 mg at 02/15/17 1837  . baclofen (LIORESAL) tablet 10 mg  10 mg Oral TID PRN [place], Peter M, MD      . carvedilol (COREG) tablet 3.125 mg  3.125 mg Oral BID WC Graciella Freer, PA-C   3.125 mg at 02/16/17 0825  . clopidogrel (PLAVIX) tablet 75 mg  75 mg Oral Daily Graciella Freer, PA-C   75 mg at 02/16/17 0950  . digoxin (LANOXIN) tablet 0.125 mg  0.125 mg Oral Daily Graciella Freer, PA-C   0.125 mg at 02/16/17 9604  . enoxaparin (LOVENOX) injection 30 mg  30 mg Subcutaneous Q24H Clegg, Amy D, NP   30 mg at 02/15/17 2122  . Influenza vac split quadrivalent PF  (FLUARIX) injection 0.5 mL  0.5 mL Intramuscular Tomorrow-1000 [place], Peter M, MD      . isosorbide mononitrate (IMDUR) 24 hr tablet 60 mg  60 mg Oral Daily Clegg, Amy D, NP   60 mg at 02/16/17 0950  . ivabradine (CORLANOR) tablet 2.5 mg  2.5 mg Oral BID WC Clegg, Amy D, NP   2.5 mg at 02/16/17 0826  . losartan (COZAAR) tablet 12.5 mg  12.5 mg Oral QHS Graciella Freer, PA-C   12.5 mg at 02/15/17 2122  . nicotine (NICODERM CQ - dosed in mg/24 hours) patch 21 mg  21 mg Transdermal Daily Clegg, Amy D, NP   21 mg at 02/16/17 0949  . ondansetron (ZOFRAN) injection 4 mg  4 mg Intravenous Q6H PRN [place], Peter M, MD   4 mg at 02/12/17 5409  . pneumococcal 23 valent vaccine (PNU-IMMUNE) injection 0.5 mL  0.5 mL Intramuscular Tomorrow-1000 [place], Peter M, MD      . sertraline (ZOLOFT) tablet 50 mg  50 mg Oral Daily [place], Peter M, MD   50 mg at 02/16/17 8119  . sodium chloride flush (NS) 0.9 % injection 3 mL  3 mL Intravenous Q12H [place], Peter M, MD   3 mL at 02/15/17 2122  . sodium chloride flush (NS) 0.9 % injection 3 mL  3 mL Intravenous PRN [place], Peter M, MD   3 mL at 02/13/17 1512  . spironolactone (ALDACTONE) tablet 12.5 mg  12.5 mg Oral Daily Graciella Freer, PA-C   12.5 mg at 02/15/17 0920  . traZODone (DESYREL) tablet 100 mg  100 mg Oral QHS [place], Peter M, MD   100 mg at 02/15/17 2122     Discharge Medications: Please see discharge summary for a list of discharge medications.  Relevant Imaging Results:  Relevant Lab Results:   Additional Information SS#: [id number]  Burna Sis, LCSW

## 2017-02-16 NOTE — Progress Notes (Signed)
.  CARDIAC REHAB PHASE I   PRE:  Rate/Rhythm: 90 SR   BP:  Sitting: 94/82      SaO2: 97% RA  MODE:  Ambulation: 270  ft   POST:  Rate/Rhythm: 95 SR  BP:  Sitting: 109/77      SaO2: 98% RA  Pt walked 270 feet with gait belt assistance and walker.  Pt had no complaints of CP or SOB. Pt assisted to bathroom after walk. Pt returned to recliner with feet elevated and call bell within reach. Ed was completed with pt. Reviewed angioplasty procedure, NTG, anti-platelet therapy, restrictions, smoking cessation, pt given fake cigarette, heart healthy/ low sodium diet, heart failure booklet, exercise guildlines and CRPII. Will send referral to [facility] CRPII. Pt assisted to bathroom after ed was completed. RN notified and call bell within reach. Will follow up tmw to reinforce education.   [phone]   York Ceriseyara R Jaxxon Naeem MS, ACSM CEP  12:12 PM 02/16/2017

## 2017-02-17 LAB — CBC
HEMATOCRIT: 34.5 % — AB (ref 36.0–46.0)
Hemoglobin: 11.6 g/dL — ABNORMAL LOW (ref 12.0–15.0)
MCH: 29.7 pg (ref 26.0–34.0)
MCHC: 33.6 g/dL (ref 30.0–36.0)
MCV: 88.5 fL (ref 78.0–100.0)
Platelets: 220 10*3/uL (ref 150–400)
RBC: 3.9 MIL/uL (ref 3.87–5.11)
RDW: 12.6 % (ref 11.5–15.5)
WBC: 8.9 10*3/uL (ref 4.0–10.5)

## 2017-02-17 LAB — BASIC METABOLIC PANEL
ANION GAP: 11 (ref 5–15)
BUN: 13 mg/dL (ref 6–20)
CO2: 26 mmol/L (ref 22–32)
Calcium: 8.8 mg/dL — ABNORMAL LOW (ref 8.9–10.3)
Chloride: 90 mmol/L — ABNORMAL LOW (ref 101–111)
Creatinine, Ser: 0.75 mg/dL (ref 0.44–1.00)
GFR calc Af Amer: >60 mL/min (ref >60)
GFR calc non Af Amer: >60 mL/min (ref >60)
Glucose, Bld: 105 mg/dL — ABNORMAL HIGH (ref 65–99)
POTASSIUM: 4.3 mmol/L (ref 3.5–5.1)
Sodium: 127 mmol/L — ABNORMAL LOW (ref 135–145)

## 2017-02-17 LAB — TROPONIN I: Troponin I: 34.02 ng/mL (ref <0.03)

## 2017-02-17 MED ORDER — IVABRADINE HCL 5 MG PO TABS: 2.5000 mg | ORAL_TABLET | Freq: Two times a day (BID) | ORAL | 3 refills | Status: DC

## 2017-02-17 MED ORDER — ALPRAZOLAM 1 MG PO TABS: 1.0000 mg | ORAL_TABLET | Freq: Three times a day (TID) | ORAL | 0 refills | Status: DC

## 2017-02-17 MED ORDER — ISOSORBIDE MONONITRATE ER 60 MG PO TB24: 60.0000 mg | ORAL_TABLET | Freq: Every day | ORAL | 6 refills | Status: AC

## 2017-02-17 MED ORDER — DIGOXIN 125 MCG PO TABS: 0.1250 mg | ORAL_TABLET | Freq: Every day | ORAL | 3 refills | Status: AC

## 2017-02-17 MED ORDER — NICOTINE 21 MG/24HR TD PT24: 21.0000 mg | MEDICATED_PATCH | Freq: Every day | TRANSDERMAL | 0 refills | Status: DC

## 2017-02-17 MED ORDER — TRAMADOL HCL 50 MG PO TABS: 50.0000 mg | ORAL_TABLET | Freq: Every day | ORAL | 0 refills | Status: DC | PRN

## 2017-02-17 MED ORDER — ALBUTEROL SULFATE (2.5 MG/3ML) 0.083% IN NEBU: 2.5000 mg | INHALATION_SOLUTION | RESPIRATORY_TRACT | 12 refills | Status: AC | PRN

## 2017-02-17 MED ORDER — PANTOPRAZOLE SODIUM 40 MG PO TBEC: 40.0000 mg | DELAYED_RELEASE_TABLET | ORAL | 1 refills | Status: AC | PRN

## 2017-02-17 MED ORDER — CLOPIDOGREL BISULFATE 75 MG PO TABS: 75.0000 mg | ORAL_TABLET | Freq: Every day | ORAL | 11 refills | Status: AC

## 2017-02-17 MED ORDER — TRAMADOL HCL 50 MG PO TABS
50.0000 mg | ORAL_TABLET | Freq: Once | ORAL | Status: AC
  Administered 2017-02-17: 50 mg via ORAL
  Filled 2017-02-17: qty 1

## 2017-02-17 MED ORDER — SPIRONOLACTONE 25 MG PO TABS: 12.5000 mg | ORAL_TABLET | Freq: Every day | ORAL | 3 refills | Status: AC

## 2017-02-17 MED ORDER — ATORVASTATIN CALCIUM 80 MG PO TABS: 80.0000 mg | ORAL_TABLET | Freq: Every day | ORAL | 6 refills | Status: AC

## 2017-02-17 MED ORDER — CARVEDILOL 3.125 MG PO TABS: 3.1250 mg | ORAL_TABLET | Freq: Two times a day (BID) | ORAL | 3 refills | Status: AC

## 2017-02-17 MED ORDER — ASPIRIN 81 MG PO TBEC: 81.0000 mg | DELAYED_RELEASE_TABLET | Freq: Every day | ORAL | 11 refills | Status: AC

## 2017-02-17 MED ORDER — LISINOPRIL 2.5 MG PO TABS: 2.5000 mg | ORAL_TABLET | Freq: Every day | ORAL | 3 refills | Status: AC

## 2017-02-17 NOTE — Progress Notes (Signed)
CARDIAC REHAB PHASE I   PRE:  Rate/Rhythm: 83  BP:  Supine:   Sitting: 92/56  Standing:     SaO2: 97 RA  MODE:  Ambulation: 150 ft   POST:  Rate/Rhythm: 87  BP:  Supine:   Sitting: 97/53  Standing:    SaO2: 98 RA 1225-1250 Assisted x1 and used walker to ambulate. Gait steady with walker. Pt c/o of feeling like her knees are weak. Pt able to walk 150 feet. S stable. Back to bed to eat lunch with call light in reach.  Melina CopaLisa Anndrea Mihelich RN 02/17/2017 12:55 PM

## 2017-02-17 NOTE — Progress Notes (Signed)
Troponin of 34 called to Cardiology coverage, no new orders received.

## 2017-02-17 NOTE — Progress Notes (Signed)
Report called to RN at [facility].  This Rn's number given to nurse at [facility] in case of questions upon patients arrival.  Prescriptions for Xanax and Tramadol with patients paperwork for facility.
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
## 2017-02-17 NOTE — Progress Notes (Signed)
Notified MD on call regarding patient's complaint of right arm pain, not radiating, upper outer arm.  No c/o chest pain or shortness of breath.  Orders received.

## 2017-02-17 NOTE — Progress Notes (Signed)
Clinical Social Worker facilitated patient discharge including contacting patient family and facility to confirm patient discharge plans.  Clinical information faxed to facility and family agreeable with plan.  CSW arranged ambulance transport [facility] .  RN Herbert SetaHeather to call [phone] (pt will go in room 106) for report prior to discharge.  Clinical Social Worker will sign off for now as social work intervention is no longer needed. Please consult us again if new need arises.  Marrianne MoodAshley Tylor Courtwright, MSW, [employer]WA [phone]

## 2017-02-21 ENCOUNTER — Telehealth (HOSPITAL_COMMUNITY): Payer: Self-pay

## 2017-02-21 NOTE — Telephone Encounter (Signed)
CHF Clinic appointment reminder call placed to patient for upcoming post-hospital follow up.  Unable to reach patient, phone number invalid.   Patient also reminded to take all medications as prescribed on the day of his/her appointment and to bring all medications to this appointment.  Advised to call our office for tardiness or cancellations/rescheduling needs.  [name], Bettina GaviaMegan Genevea

## 2017-02-22 ENCOUNTER — Encounter (HOSPITAL_COMMUNITY): Payer: Medicare Other

## 2017-08-28 ENCOUNTER — Emergency Department (HOSPITAL_COMMUNITY): Payer: Medicare Other

## 2017-08-28 ENCOUNTER — Other Ambulatory Visit: Payer: Self-pay

## 2017-08-28 ENCOUNTER — Encounter (HOSPITAL_COMMUNITY): Payer: Self-pay | Admitting: Emergency Medicine

## 2017-08-28 ENCOUNTER — Observation Stay (HOSPITAL_COMMUNITY)
Admission: EM | Admit: 2017-08-28 | Discharge: 2017-08-29 | Disposition: A | Discharge status: Home / Self Care | Payer: Medicare Other | Attending: Interventional Cardiology | Admitting: Interventional Cardiology

## 2017-08-28 ENCOUNTER — Encounter (HOSPITAL_COMMUNITY)
Admission: EM | Disposition: A | Discharge status: Home / Self Care | Payer: Self-pay | Source: Home / Self Care | Attending: Emergency Medicine

## 2017-08-28 ENCOUNTER — Observation Stay (HOSPITAL_COMMUNITY): Payer: Medicare Other

## 2017-08-28 DIAGNOSIS — R072 Precordial pain: Secondary | ICD-10-CM | POA: Diagnosis present

## 2017-08-28 DIAGNOSIS — I5042 Chronic combined systolic (congestive) and diastolic (congestive) heart failure: Secondary | ICD-10-CM | POA: Diagnosis not present

## 2017-08-28 DIAGNOSIS — M479 Spondylosis, unspecified: Secondary | ICD-10-CM | POA: Diagnosis not present

## 2017-08-28 DIAGNOSIS — Z87891 Personal history of nicotine dependence: Secondary | ICD-10-CM | POA: Diagnosis not present

## 2017-08-28 DIAGNOSIS — M7530 Calcific tendinitis of unspecified shoulder: Secondary | ICD-10-CM | POA: Insufficient documentation

## 2017-08-28 DIAGNOSIS — I2510 Atherosclerotic heart disease of native coronary artery without angina pectoris: Secondary | ICD-10-CM | POA: Diagnosis not present

## 2017-08-28 DIAGNOSIS — R7989 Other specified abnormal findings of blood chemistry: Secondary | ICD-10-CM | POA: Diagnosis not present

## 2017-08-28 DIAGNOSIS — Z880 Allergy status to penicillin: Secondary | ICD-10-CM | POA: Diagnosis not present

## 2017-08-28 DIAGNOSIS — I252 Old myocardial infarction: Secondary | ICD-10-CM | POA: Insufficient documentation

## 2017-08-28 DIAGNOSIS — E785 Hyperlipidemia, unspecified: Secondary | ICD-10-CM

## 2017-08-28 DIAGNOSIS — Z885 Allergy status to narcotic agent status: Secondary | ICD-10-CM | POA: Diagnosis not present

## 2017-08-28 DIAGNOSIS — Z7982 Long term (current) use of aspirin: Secondary | ICD-10-CM | POA: Diagnosis not present

## 2017-08-28 DIAGNOSIS — I200 Unstable angina: Secondary | ICD-10-CM | POA: Diagnosis present

## 2017-08-28 DIAGNOSIS — Z7902 Long term (current) use of antithrombotics/antiplatelets: Secondary | ICD-10-CM | POA: Diagnosis not present

## 2017-08-28 DIAGNOSIS — I25110 Atherosclerotic heart disease of native coronary artery with unstable angina pectoris: Secondary | ICD-10-CM | POA: Diagnosis not present

## 2017-08-28 DIAGNOSIS — I110 Hypertensive heart disease with heart failure: Secondary | ICD-10-CM | POA: Insufficient documentation

## 2017-08-28 DIAGNOSIS — I10 Essential (primary) hypertension: Secondary | ICD-10-CM | POA: Diagnosis present

## 2017-08-28 HISTORY — DX: Chronic combined systolic (congestive) and diastolic (congestive) heart failure: I50.42

## 2017-08-28 LAB — CBC
HEMATOCRIT: 38.9 % (ref 36.0–46.0)
HEMOGLOBIN: 13.1 g/dL (ref 12.0–15.0)
MCH: 29.8 pg (ref 26.0–34.0)
MCHC: 33.7 g/dL (ref 30.0–36.0)
MCV: 88.6 fL (ref 78.0–100.0)
Platelets: 214 10*3/uL (ref 150–400)
RBC: 4.39 MIL/uL (ref 3.87–5.11)
RDW: 13.4 % (ref 11.5–15.5)
WBC: 9.3 10*3/uL (ref 4.0–10.5)

## 2017-08-28 LAB — I-STAT TROPONIN, ED
Troponin i, poc: 0.01 ng/mL (ref 0.00–0.08)
Troponin i, poc: 0.01 ng/mL (ref 0.00–0.08)

## 2017-08-28 LAB — BASIC METABOLIC PANEL
ANION GAP: 8 (ref 5–15)
BUN: 17 mg/dL (ref 6–20)
CO2: 23 mmol/L (ref 22–32)
Calcium: 9.8 mg/dL (ref 8.9–10.3)
Chloride: 99 mmol/L — ABNORMAL LOW (ref 101–111)
Creatinine, Ser: 0.62 mg/dL (ref 0.44–1.00)
GFR calc non Af Amer: >60 mL/min (ref >60)
Glucose, Bld: 93 mg/dL (ref 65–99)
POTASSIUM: 4.4 mmol/L (ref 3.5–5.1)
SODIUM: 130 mmol/L — AB (ref 135–145)

## 2017-08-28 LAB — TROPONIN I
Troponin I: <0.03 ng/mL (ref <0.03)
Troponin I: <0.03 ng/mL (ref <0.03)
Troponin I: <0.03 ng/mL (ref <0.03)

## 2017-08-28 LAB — MAGNESIUM: Magnesium: 1.9 mg/dL (ref 1.7–2.4)

## 2017-08-28 LAB — I-STAT BETA HCG BLOOD, ED (MC, WL, AP ONLY)

## 2017-08-28 LAB — LIPID PANEL
CHOL/HDL RATIO: 3.3 ratio
Cholesterol: 157 mg/dL (ref 0–200)
HDL: 47 mg/dL (ref >40)
LDL Cholesterol: 89 mg/dL (ref 0–99)
Triglycerides: 103 mg/dL (ref <150)
VLDL: 21 mg/dL (ref 0–40)

## 2017-08-28 LAB — PROTIME-INR
INR: 0.95
PROTHROMBIN TIME: 12.6 s (ref 11.4–15.2)

## 2017-08-28 LAB — TSH: TSH: <0.01 u[IU]/mL — ABNORMAL LOW (ref 0.350–4.500)

## 2017-08-28 LAB — MRSA PCR SCREENING: MRSA by PCR: NEGATIVE

## 2017-08-28 LAB — I-STAT CHEM 8, ED
BUN: 16 mg/dL (ref 6–20)
CHLORIDE: 99 mmol/L — AB (ref 101–111)
CREATININE: 0.7 mg/dL (ref 0.44–1.00)
Calcium, Ion: 1.29 mmol/L (ref 1.15–1.40)
Glucose, Bld: 84 mg/dL (ref 65–99)
HEMATOCRIT: 41 % (ref 36.0–46.0)
HEMOGLOBIN: 13.9 g/dL (ref 12.0–15.0)
POTASSIUM: 4.5 mmol/L (ref 3.5–5.1)
Sodium: 133 mmol/L — ABNORMAL LOW (ref 135–145)
TCO2: 23 mmol/L (ref 22–32)

## 2017-08-28 LAB — BRAIN NATRIURETIC PEPTIDE: B Natriuretic Peptide: 110.1 pg/mL — ABNORMAL HIGH (ref 0.0–100.0)

## 2017-08-28 LAB — DIGOXIN LEVEL: Digoxin Level: 0.5 ng/mL — ABNORMAL LOW (ref 0.8–2.0)

## 2017-08-28 LAB — HEMOGLOBIN A1C
HEMOGLOBIN A1C: 5.6 % (ref 4.8–5.6)
Mean Plasma Glucose: 114.02 mg/dL

## 2017-08-28 LAB — APTT: APTT: 28 s (ref 24–36)

## 2017-08-28 LAB — D-DIMER, QUANTITATIVE: D-Dimer, Quant: 5.29 ug/mL-FEU — ABNORMAL HIGH (ref 0.00–0.50)

## 2017-08-28 SURGERY — LEFT HEART CATH AND CORONARY ANGIOGRAPHY
Anesthesia: LOCAL

## 2017-08-28 MED ORDER — ALPRAZOLAM 0.25 MG PO TABS
0.2500 mg | ORAL_TABLET | Freq: Three times a day (TID) | ORAL | Status: DC | PRN
  Administered 2017-08-28 – 2017-08-29 (×2): 0.25 mg via ORAL
  Filled 2017-08-28 (×2): qty 1

## 2017-08-28 MED ORDER — SODIUM CHLORIDE 0.9 % IV SOLN
INTRAVENOUS | Status: DC
  Administered 2017-08-28: 09:00:00 via INTRAVENOUS

## 2017-08-28 MED ORDER — SODIUM CHLORIDE 0.9% FLUSH
3.0000 mL | Freq: Two times a day (BID) | INTRAVENOUS | Status: DC
  Administered 2017-08-28: 3 mL via INTRAVENOUS

## 2017-08-28 MED ORDER — HEPARIN (PORCINE) IN NACL 100-0.45 UNIT/ML-% IJ SOLN
550.0000 [IU]/h | INTRAMUSCULAR | Status: DC
  Administered 2017-08-28: 550 [IU]/h via INTRAVENOUS
  Filled 2017-08-28: qty 250

## 2017-08-28 MED ORDER — ASPIRIN EC 81 MG PO TBEC
81.0000 mg | DELAYED_RELEASE_TABLET | Freq: Every day | ORAL | Status: DC
  Administered 2017-08-29: 81 mg via ORAL
  Filled 2017-08-28: qty 1

## 2017-08-28 MED ORDER — SPIRONOLACTONE 12.5 MG HALF TABLET
12.5000 mg | ORAL_TABLET | Freq: Every day | ORAL | Status: DC
  Administered 2017-08-29: 12.5 mg via ORAL
  Filled 2017-08-28: qty 1

## 2017-08-28 MED ORDER — NITROGLYCERIN 0.4 MG SL SUBL: 0.4000 mg | SUBLINGUAL_TABLET | SUBLINGUAL | Status: DC | PRN

## 2017-08-28 MED ORDER — CARVEDILOL 3.125 MG PO TABS
3.1250 mg | ORAL_TABLET | Freq: Two times a day (BID) | ORAL | Status: DC
  Administered 2017-08-28 – 2017-08-29 (×2): 3.125 mg via ORAL
  Filled 2017-08-28 (×3): qty 1

## 2017-08-28 MED ORDER — ISOSORBIDE MONONITRATE ER 60 MG PO TB24
60.0000 mg | ORAL_TABLET | Freq: Every day | ORAL | Status: DC
  Administered 2017-08-29: 60 mg via ORAL
  Filled 2017-08-28: qty 1

## 2017-08-28 MED ORDER — TRAMADOL HCL 50 MG PO TABS: 50.0000 mg | ORAL_TABLET | Freq: Two times a day (BID) | ORAL | Status: DC | PRN

## 2017-08-28 MED ORDER — CLOPIDOGREL BISULFATE 75 MG PO TABS
75.0000 mg | ORAL_TABLET | Freq: Every day | ORAL | Status: DC
  Administered 2017-08-29: 75 mg via ORAL
  Filled 2017-08-28: qty 1

## 2017-08-28 MED ORDER — HEPARIN SODIUM (PORCINE) 5000 UNIT/ML IJ SOLN
60.0000 [IU]/kg | Freq: Once | INTRAMUSCULAR | Status: AC
  Administered 2017-08-28: 2900 [IU] via INTRAVENOUS
  Filled 2017-08-28: qty 1

## 2017-08-28 MED ORDER — IOPAMIDOL (ISOVUE-370) INJECTION 76%
INTRAVENOUS | Status: AC
  Filled 2017-08-28: qty 100

## 2017-08-28 MED ORDER — SERTRALINE HCL 50 MG PO TABS
50.0000 mg | ORAL_TABLET | Freq: Every day | ORAL | Status: DC
  Administered 2017-08-29: 50 mg via ORAL
  Filled 2017-08-28: qty 1

## 2017-08-28 MED ORDER — LISINOPRIL 10 MG PO TABS: 10.0000 mg | ORAL_TABLET | Freq: Every day | ORAL | Status: DC

## 2017-08-28 MED ORDER — ZOLPIDEM TARTRATE 5 MG PO TABS
5.0000 mg | ORAL_TABLET | Freq: Every evening | ORAL | Status: DC | PRN
  Administered 2017-08-28: 5 mg via ORAL
  Filled 2017-08-28: qty 1

## 2017-08-28 MED ORDER — ATORVASTATIN CALCIUM 80 MG PO TABS: 80.0000 mg | ORAL_TABLET | Freq: Every day | ORAL | Status: DC

## 2017-08-28 MED ORDER — DIGOXIN 125 MCG PO TABS
0.1250 mg | ORAL_TABLET | Freq: Every day | ORAL | Status: DC
  Administered 2017-08-29: 0.125 mg via ORAL
  Filled 2017-08-28: qty 1

## 2017-08-28 MED ORDER — ACETAMINOPHEN 325 MG PO TABS: 650.0000 mg | ORAL_TABLET | ORAL | Status: DC | PRN

## 2017-08-28 MED ORDER — IOPAMIDOL (ISOVUE-370) INJECTION 76%
100.0000 mL | Freq: Once | INTRAVENOUS | Status: AC | PRN
  Administered 2017-08-28: 100 mL via INTRAVENOUS

## 2017-08-28 MED ORDER — SODIUM CHLORIDE 0.9% FLUSH: 3.0000 mL | INTRAVENOUS | Status: DC | PRN

## 2017-08-28 MED ORDER — ONDANSETRON HCL 4 MG/2ML IJ SOLN: 4.0000 mg | Freq: Four times a day (QID) | INTRAMUSCULAR | Status: DC | PRN

## 2017-08-28 MED ORDER — FUROSEMIDE 20 MG PO TABS
20.0000 mg | ORAL_TABLET | Freq: Every day | ORAL | Status: DC
  Administered 2017-08-29: 20 mg via ORAL
  Filled 2017-08-28: qty 1

## 2017-08-28 MED ORDER — SODIUM CHLORIDE 0.9 % IV SOLN: 250.0000 mL | INTRAVENOUS | Status: DC | PRN

## 2017-08-28 NOTE — ED Notes (Signed)
PT was given 324 mg of po aspirin and two SL nitro prior to ED arrival.

## 2017-08-28 NOTE — ED Triage Notes (Signed)
Pt c/o of right sided cp that radiates to right arm and upper back with SOB.

## 2017-08-28 NOTE — ED Notes (Signed)
Report given to Lew Dawes, RN Engineer, manufacturing systems at [employer]). [facility] here to transport pt to [facility] and [facility] staff to go on transport as well.

## 2017-08-28 NOTE — ED Notes (Signed)
Cardiology at bedside.

## 2017-08-28 NOTE — ED Notes (Signed)
Paged Dr Anthonette Legato to Dr Jacqulyn Bath for Code [facility]

## 2017-08-28 NOTE — ED Provider Notes (Addendum)
Emergency Department Provider Note   I have reviewed the triage vital signs and the nursing notes.   HISTORY  Chief Complaint Chest Pain   HPI Julie Simpson is a 52 y.o. female with PMH of CAD with LAD occlusion, HLD, HTN,'s to the emergency department for evaluation of acute onset central chest and right-sided chest pain radiating to the right arm and upper back.  Patient has associated dyspnea.  States this feels similar to her prior heart attack symptoms.  The patient had some associated shortness of breath.  She is currently a prisoner and she alerted the guards regarding her symptoms.  EMS was called and they administered aspirin as well as 2 nitroglycerin which resolved the patient's chest pain.  She did have resulting hypotension in route with EMS which improved after no additional nitroglycerin was necessary.    Past Medical History:  Diagnosis Date  . Acute ST elevation myocardial infarction (STEMI) involving LAD: 02/11/2017   Likely delayed presentation - 100% mLAD - s/p PTCA 20% (slow reflow); Also noted Severe 80% Ost LM, 75% mCx & 80% OM2 as well as ~60% non-dom RCA. Severely Elevated LVEDP  . Bicipital tenosynovitis   . Calcifying tendinitis of shoulder   . Cervical spondylosis without myelopathy   . Chronic combined systolic (congestive) and diastolic (congestive) heart failure (HCC) 08/28/2017  . Essential hypertension   . Hyperlipidemia with target LDL less than 70 02/12/2017  . 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 Left main coronary artery disease: 80% stenosis confirmed by IVUS; 02/11/2017   Ost LM lesion, 80 %stenosed. 2nd Mrg lesion, 85 %stenosed. Mid Cx to Dist Cx lesion, 75 %stenosed. Prox LAD lesion, 100 %stenosed. -- PTCA: Post intervention, there is a 20% residual stenosis. Prox RCA to Mid RCA lesion, 60 %stenosed. (Non-dominant) LV end diastolic pressure is  severely elevated.  . Personality change due to conditions classified elsewhere   . Tobacco use 02/12/2017    Patient Active Problem List   Diagnosis Date Noted  . Chronic combined systolic (congestive) and diastolic (congestive) heart failure (HCC) 08/28/2017  . Unstable angina (HCC) 08/28/2017  . Hyperlipidemia with target LDL less than 70 02/12/2017  . Tobacco use 02/12/2017  . Acute combined systolic and diastolic heart failure (HCC) 02/12/2017  . Essential hypertension   . Acute ST elevation myocardial infarction (STEMI) involving LAD: 02/11/2017    Class: Hospitalized for  . Left main coronary artery disease: 80% stenosis confirmed by IVUS; 02/11/2017  . Protein-calorie malnutrition, severe (HCC) 12/23/2013  . Goiter 09/24/2012  . Depression 05/28/2012  . Knee osteoarthritis 11/01/2011  . Patellar tendinitis (bilateral) 08/02/2011  . Greater trochanteric bursitis right 08/02/2011  . Traumatic brain injury (HCC) - h/o 08/02/2011  . Chronic pain 08/02/2011    Past Surgical History:  Procedure Laterality Date  . COLON SURGERY     polypectomy  . CORONARY BALLOON ANGIOPLASTY N/A 02/11/2017   Procedure: CORONARY BALLOON ANGIOPLASTY;  Surgeon: Swaziland, Peter M, MD;  Location: [facility] INVASIVE CV LAB;  Service: Cardiovascular;  Laterality: N/A;  . HERNIA REPAIR    . INTRAVASCULAR ULTRASOUND/IVUS N/A 02/11/2017   Procedure: Intravascular Ultrasound/IVUS;  Surgeon: Swaziland, Peter M, MD;  Location: [facility] INVASIVE CV LAB;  Service: Cardiovascular;  Laterality: N/A;  . LEFT HEART CATH AND CORONARY ANGIOGRAPHY N/A 02/11/2017   Procedure: LEFT HEART CATH AND CORONARY ANGIOGRAPHY;  Surgeon: Swaziland, Peter M, MD;  Location: [facility] INVASIVE CV LAB;  Service: Cardiovascular;  Laterality: N/A;  . TUBAL LIGATION      Current Outpatient Rx  . Order #: [id number] Class: Normal  . Order #: [id number] Class: Normal  . Order #: [id number] Class: Normal  . Order #: [id number] Class: Normal  . Order #:  [id number] Class: Historical Med  . Order #: [id number] Class: Historical Med  . Order #: [id number] Class: Normal  . Order #: [id number] Class: Historical Med  . Order #: [id number] Class: Historical Med  . Order #: [id number] Class: Normal  . Order #: [id number] Class: Normal  . Order #: [id number] Class: Normal  . Order #: [id number] Class: Print  . Order #: [id number] Class: Normal  . Order #: [id number] Class: Normal  . Order #: [id number] Class: Normal  . Order #: [id number] Class: Normal  . Order #: [id number] Class: Normal  . Order #: [id number] Class: Print  . Order #: [id number] Class: Normal    Allergies Codeine and Penicillins  Family History  Problem Relation Age of Onset  . Cancer Mother 67       brain tumor  . Diabetes Father     Social History Social History   Tobacco Use  . Smoking status: Former Smoker    Packs/day: 1.00    Years: 30.00    Pack years: 30.00    Last attempt to quit: 08/06/2017    Years since quitting: 0.0  . Smokeless tobacco: Never Used  Substance Use Topics  . Alcohol use: Not Currently    Comment: Hx abuse, quit 01/2017  . Drug use: Not Currently    Review of Systems  Constitutional: No fever/chills Eyes: No visual changes. ENT: No sore throat. Cardiovascular: Positive chest pain. Respiratory: Positive shortness of breath. Gastrointestinal: No abdominal pain.  No nausea, no vomiting.  No diarrhea.  No constipation. Genitourinary: Negative for dysuria. Musculoskeletal: Negative for back pain. Skin: Negative for rash. Neurological: Negative for headaches, focal weakness or numbness.  10-point ROS otherwise negative.  ____________________________________________   PHYSICAL EXAM:  VITAL SIGNS: ED Triage Vitals  Enc Vitals Group     BP 08/28/17 0849 120/80     Pulse Rate 08/28/17 0849 85     Resp 08/28/17 0849 18     Temp 08/28/17 0849 98 F (36.7 C)     Temp Source 08/28/17 0849 Oral     SpO2 08/28/17 0849 94 %     Weight 08/28/17 0846 106 lb  (48.1 kg)     Height 08/28/17 0846  (1.473 m)     Pain Score 08/28/17 0846 3   Constitutional: Alert and oriented. Well appearing and in no acute distress. Eyes: Conjunctivae are normal. Head: Atraumatic. Nose: No congestion/rhinnorhea. Mouth/Throat: Mucous membranes are moist.  Oropharynx non-erythematous. Neck: No stridor. Cardiovascular: Normal rate, regular rhythm. Good peripheral circulation. Grossly normal heart sounds.   Respiratory: Normal respiratory effort.  No retractions. Lungs CTAB. Gastrointestinal: Soft and nontender. No distention.  Musculoskeletal: No lower extremity tenderness nor edema. No gross deformities of extremities. Neurologic:  Normal speech and language. No gross focal neurologic deficits are appreciated.  Skin:  Skin is warm, dry and intact. No rash noted.  ____________________________________________   LABS (all labs ordered are listed, but only abnormal results are displayed)  Labs Reviewed  BASIC METABOLIC PANEL - Abnormal; Notable for the following components:      Result Value   Sodium 130 (*)    Chloride 99 (*)    All other components within normal limits  D-DIMER, QUANTITATIVE (NOT AT [facility]) - Abnormal; Notable for the following components:   D-Dimer, Quant 5.29 (*)  All other components within normal limits  TSH - Abnormal; Notable for the following components:   TSH <0.010 (*)    All other components within normal limits  BRAIN NATRIURETIC PEPTIDE - Abnormal; Notable for the following components:   B Natriuretic Peptide 110.1 (*)    All other components within normal limits  DIGOXIN LEVEL - Abnormal; Notable for the following components:   Digoxin Level 0.5 (*)    All other components within normal limits  I-STAT CHEM 8, ED - Abnormal; Notable for the following components:   Sodium 133 (*)    Chloride 99 (*)    All other components within normal limits  CBC  PROTIME-INR  APTT  TROPONIN I  LIPID PANEL  MAGNESIUM  TROPONIN I   HEMOGLOBIN A1C  HIV ANTIBODY (ROUTINE TESTING)  TROPONIN I  TROPONIN I  I-STAT TROPONIN, ED  I-STAT BETA HCG BLOOD, ED ([facility], [facility], [facility] ONLY)  I-STAT TROPONIN, ED   ____________________________________________  EKG   EKG Interpretation  Date/Time:  Tuesday Aug 28 2017 09:59:58 EDT Ventricular Rate:  72 PR Interval:    QRS Duration: 82 QT Interval:  410 QTC Calculation: 449 R Axis:   70 Text Interpretation:  Sinus rhythm Anteroseptal infarct, age indeterminate No significant change since last tracing Confirmed by Richardean Canal [phone]) on 08/28/2017 10:17:42 AM Also confirmed by Richardean Canal [phone]), editor Barbette Hair [phone])  on 08/28/2017 11:09:09 AM       ____________________________________________  RADIOLOGY  Ct Angio Chest Pe W And/or Wo Contrast  Result Date: 08/28/2017 CLINICAL DATA:  Acute presentation with chest pain and shortness of breath. High pretest probability of pulmonary emboli. EXAM: CT ANGIOGRAPHY CHEST WITH CONTRAST TECHNIQUE: Multidetector CT imaging of the chest was performed using the standard protocol during bolus administration of intravenous contrast. Multiplanar CT image reconstructions and MIPs were obtained to evaluate the vascular anatomy. CONTRAST:  ISOVUE-370 IOPAMIDOL (ISOVUE-370) INJECTION 76% COMPARISON:  None. FINDINGS: Cardiovascular: Pulmonary arterial opacification is excellent. There are no pulmonary emboli. There is aortic atherosclerosis. There is coronary artery calcification. Heart size is normal. No pericardial fluid. Mediastinum/Nodes: No mass or lymphadenopathy. Lungs/Pleura: Lungs are clear.  No pleural fluid. Upper Abdomen: Negative Musculoskeletal: Curvature convex to the right. Partial compression fracture of T7, old. Review of the MIP images confirms the above findings. IMPRESSION: Negative for pulmonary emboli or other acute chest pathology. Aortic atherosclerosis. Coronary artery calcification. Old T7 fracture. Electronically Signed    By: Paulina Fusi M.D.   On: 08/28/2017 13:35   Dg Chest Port 1 View  Result Date: 08/28/2017 CLINICAL DATA:  Chest pain and shortness of breath today, history of previous myocardial infarction, pre cardiac catheterization EXAM: PORTABLE CHEST 1 VIEW COMPARISON:  Chest x-ray of 04/06/2017 and 02/11/2017 FINDINGS: No active infiltrate or effusion is seen. Mediastinal and hilar contours are unremarkable. The heart is borderline enlarged and stable. No acute bony abnormality is seen. IMPRESSION: No active lung disease.  Stable borderline cardiomegaly. Electronically Signed   By: Dwyane Dee M.D.   On: 08/28/2017 09:38    ____________________________________________   PROCEDURES  Procedure(s) performed:   .Critical Care Performed by: Maia Plan, MD Authorized by: Maia Plan, MD   Critical care provider statement:    Critical care time (minutes):  35   Critical care time was exclusive of:  Separately billable procedures and treating other patients and teaching time   Critical care was necessary to treat or prevent imminent or life-threatening deterioration of  the following conditions:  Cardiac failure   Critical care was time spent personally by me on the following activities:  Blood draw for specimens, development of treatment plan with patient or surrogate, discussions with consultants, evaluation of patient's response to treatment, examination of patient, obtaining history from patient or surrogate, ordering and performing treatments and interventions, ordering and review of laboratory studies, ordering and review of radiographic studies, pulse oximetry, re-evaluation of patient's condition and review of old charts   I assumed direction of critical care for this patient from another provider in my specialty: no       ____________________________________________   INITIAL IMPRESSION / ASSESSMENT AND PLAN / ED COURSE  Pertinent labs & imaging results that were available during my  care of the patient were reviewed by me and considered in my medical decision making (see chart for details).  Patient presents to the emergency department for evaluation of chest pain.  She has a complicated past cardiac history including recent acute MI with angioplasty of the proximal LAD but area was unable to be stented. ST elevation in I and aVL with ST depressions inferior and lateral Code STEMI activated. Patient is currently CP-free after nitro.   09:00 AM With Dr. Herbie Baltimore with cardiology.  Agrees to accept the patient as a code STEMI to the Cath Lab.  Patient's recent cath showed left main disease.  It is unclear if this will be amenable to stenting or if the patient will require CABG. I will start heparin here.  Patient has received aspirin already.  Currently chest pain-free after nitroglycerin. EMS en route for emergency transport. Reviewed initial labs and CXR results.   ____________________________________________  FINAL CLINICAL IMPRESSION(S) / ED DIAGNOSES  Final diagnoses:  Precordial chest pain     MEDICATIONS GIVEN DURING THIS VISIT:  Medications  0.9 %  sodium chloride infusion ( Intravenous Stopped 08/28/17 1017)  heparin ADULT infusion 100 units/mL (25000 units/214mL sodium chloride 0.45%) (550 Units/hr Intravenous New Bag/Given 08/28/17 1015)  iopamidol (ISOVUE-370) 76 % injection (has no administration in time range)  nitroGLYCERIN (NITROSTAT) SL tablet 0.4 mg (has no administration in time range)  acetaminophen (TYLENOL) tablet 650 mg (has no administration in time range)  ondansetron (ZOFRAN) injection 4 mg (has no administration in time range)  zolpidem (AMBIEN) tablet 5 mg (has no administration in time range)  sodium chloride flush (NS) 0.9 % injection 3 mL (3 mLs Intravenous Not Given 08/28/17 1159)  sodium chloride flush (NS) 0.9 % injection 3 mL (has no administration in time range)  0.9 %  sodium chloride infusion (has no administration in time range)    ALPRAZolam (XANAX) tablet 0.25 mg (has no administration in time range)  aspirin EC tablet 81 mg (81 mg Oral Not Given 08/28/17 1213)  carvedilol (COREG) tablet 3.125 mg (has no administration in time range)  clopidogrel (PLAVIX) tablet 75 mg (has no administration in time range)  digoxin (LANOXIN) tablet 0.125 mg (has no administration in time range)  isosorbide mononitrate (IMDUR) 24 hr tablet 60 mg (has no administration in time range)  sertraline (ZOLOFT) tablet 50 mg (50 mg Oral Not Given 08/28/17 1319)  spironolactone (ALDACTONE) tablet 12.5 mg (12.5 mg Oral Not Given 08/28/17 1319)  furosemide (LASIX) tablet 20 mg (20 mg Oral Not Given 08/28/17 1319)  heparin injection 2,900 Units (2,900 Units Intravenous Given 08/28/17 0905)  iopamidol (ISOVUE-370) 76 % injection 100 mL (100 mLs Intravenous Contrast Given 08/28/17 1226)    Note:  This document was prepared  using Conservation officer, historic buildings and may include unintentional dictation errors.  Alona Bene, MD Emergency Medicine    Long, Arlyss Repress, MD 08/28/17 [id number]    Maia Plan, MD 08/28/17 [phone]

## 2017-08-28 NOTE — ED Notes (Signed)
Received pt from [facility]. Pt rates pain 2/10. Pt is alert and oriented.

## 2017-08-28 NOTE — H&P (Addendum)
Cardiology Consultation:   Patient ID: Julie Simpson; [id number]; 08-21-1965   Admit date: 08/28/2017 Date of Consult: 08/28/2017  Primary Care Provider: Mickle Plumb, NP Primary Cardiologist: Dr Zachery Conch w/ [facility] Cardiothoracic Surgery: Dr Delilah Shan, [facility] Primary Electrophysiologist:  n/a   Patient Profile:   Julie Simpson is a 52 y.o. female with a hx of late-presenting STEMI 01/2017 w/ PTCA LAD (poor distal flow) and 80% dLMain (not CABG candidate), S-D-CHF, tob use, HTN, HLD, previous intracranial injury, who is being seen today for the evaluation of chest pain at the request of Dr Silverio Lay.  History of Present Illness:   Julie Simpson has been followed by Dr Zachery Conch and Dr Justice Britain in [place]. She was to have a cardiac MRI for viability but the LAD territory did not have viability>>CABG not planned, possible PCI per Dr Bufford Buttner but she has not seen him back yet.   Pt was incarcerated about 2 weeks ago. She had quit smoking a few days before then.   Today, about 6:30 am, while in bed, she had sudden onset of chest pain. Sharp and stabbing. It went from the R to the L chest and radiated into her R arm and upper back. Worse w/ deep inspiration. Was breathing very fast. 8/10. Her usual chest pain. Pt got SL NTG x 2, pain improved after the 2nd tab and was 2/10 on arrival to the ER.  Pt got 324 mg ASA and IV heparin in the ER, currently pain-free.    Past Medical History:  Diagnosis Date  . Acute ST elevation myocardial infarction (STEMI) involving LAD: 02/11/2017   Likely delayed presentation - 100% mLAD - s/p PTCA 20% (slow reflow); Also noted Severe 80% Ost LM, 75% mCx & 80% OM2 as well as ~60% non-dom RCA. Severely Elevated LVEDP  . Bicipital tenosynovitis   . Calcifying tendinitis of shoulder   . Cervical spondylosis without myelopathy   . Essential hypertension   . Hyperlipidemia with target LDL less than 70 02/12/2017  . Intracranial injury of other and unspecified nature, without  mention of open intracranial wound, unspecified state of consciousness   . Left main coronary artery disease: 80% stenosis confirmed by IVUS; 02/11/2017   Ost LM lesion, 80 %stenosed. 2nd Mrg lesion, 85 %stenosed. Mid Cx to Dist Cx lesion, 75 %stenosed. Prox LAD lesion, 100 %stenosed. -- PTCA: Post intervention, there is a 20% residual stenosis. Prox RCA to Mid RCA lesion, 60 %stenosed. (Non-dominant) LV end diastolic pressure is severely elevated.  . Personality change due to conditions classified elsewhere   . Tobacco use 02/12/2017    Past Surgical History:  Procedure Laterality Date  . COLON SURGERY     polypectomy  . CORONARY BALLOON ANGIOPLASTY N/A 02/11/2017   Procedure: CORONARY BALLOON ANGIOPLASTY;  Surgeon: Swaziland, Peter M, MD;  Location: [facility] INVASIVE CV LAB;  Service: Cardiovascular;  Laterality: N/A;  . HERNIA REPAIR    . INTRAVASCULAR ULTRASOUND/IVUS N/A 02/11/2017   Procedure: Intravascular Ultrasound/IVUS;  Surgeon: Swaziland, Peter M, MD;  Location: [facility] INVASIVE CV LAB;  Service: Cardiovascular;  Laterality: N/A;  . LEFT HEART CATH AND CORONARY ANGIOGRAPHY N/A 02/11/2017   Procedure: LEFT HEART CATH AND CORONARY ANGIOGRAPHY;  Surgeon: Swaziland, Peter M, MD;  Location: [facility] INVASIVE CV LAB;  Service: Cardiovascular;  Laterality: N/A;  . TUBAL LIGATION       Medication Sig Start Date  albuterol (PROVENTIL) (2.5 MG/3ML) 0.083% nebulizer solution Take 3 mLs (2.5 mg total) by nebulization every 4 (four) hours as needed for  wheezing or shortness of breath. 02/17/17  ALPRAZolam (XANAX) 1 MG tablet Take 1 tablet (1 mg total) by mouth 3 (three) times daily. 02/17/17  aspirin EC 81 MG EC tablet Take 1 tablet (81 mg total) by mouth daily. 02/18/17  atorvastatin (LIPITOR) 80 MG tablet Take 1 tablet (80 mg total) by mouth daily at 6 PM. 02/17/17  baclofen (LIORESAL) 10 MG tablet Take 1 tablet (10 mg total) by mouth 3 (three) times daily as needed for muscle spasms. 01/11/15  carvedilol (COREG)  3.125 MG tablet Take 1 tablet (3.125 mg total) by mouth 2 (two) times daily with a meal. 02/17/17  clopidogrel (PLAVIX) 75 MG tablet Take 1 tablet (75 mg total) by mouth daily. 02/18/17  digoxin (LANOXIN) 0.125 MG tablet Take 1 tablet (0.125 mg total) by mouth daily. 02/18/17  isosorbide mononitrate (IMDUR) 60 MG 24 hr tablet Take 1 tablet (60 mg total) by mouth daily. 02/18/17  ivabradine (CORLANOR) 5 MG TABS tablet Take 0.5 tablets (2.5 mg total) by mouth 2 (two) times daily with a meal. 02/17/17  lisinopril (PRINIVIL,ZESTRIL) 2.5 MG tablet Take 1 tablet (2.5 mg total) by mouth daily. 02/17/17  nicotine (NICODERM CQ - DOSED IN MG/24 HOURS) 21 mg/24hr patch Place 1 patch (21 mg total) onto the skin daily. 02/18/17  pantoprazole (PROTONIX) 40 MG tablet Take 1 tablet (40 mg total) by mouth as needed (for GERD). 02/17/17  sertraline (ZOLOFT) 50 MG tablet Take 1 tablet (50 mg total) by mouth daily. Patient taking differently: Take 50 mg by mouth daily as needed.  01/11/15  spironolactone (ALDACTONE) 25 MG tablet Take 0.5 tablets (12.5 mg total) by mouth daily. 02/18/17  traMADol (ULTRAM) 50 MG tablet Take 1 tablet (50 mg total) by mouth daily as needed. 02/17/17  traZODone (DESYREL) 100 MG tablet TAKE ONE TABLET AT BEDTIME 06/23/16    Inpatient Medications: Scheduled Meds:  Continuous Infusions: . sodium chloride Stopped (08/28/17 1017)  . heparin 550 Units/hr (08/28/17 1015)   PRN Meds:   Allergies:    Allergies  Allergen Reactions  . Codeine Rash  . Penicillins Rash    Has patient had a PCN reaction causing immediate rash, facial/tongue/throat swelling, SOB or lightheadedness with hypotension: No Has patient had a PCN reaction causing severe rash involving mucus membranes or skin necrosis: No Has patient had a PCN reaction that required hospitalization: No Has patient had a PCN reaction occurring within the last 10 years: No If all of the above answers are "NO", then may proceed with  Cephalosporin use.    Social History:   Social History   Socioeconomic History  . Marital status: Married    Spouse name: Not on file  . Number of children: Not on file  . Years of education: Not on file  . Highest education level: Not on file  Occupational History    Employer: DISABLED  Social Needs  . Financial resource strain: Not on file  . Food insecurity:    Worry: Not on file    Inability: Not on file  . Transportation needs:    Medical: Not on file    Non-medical: Not on file  Tobacco Use  . Smoking status: Former Smoker    Packs/day: 1.00    Years: 30.00    Pack years: 30.00    Last attempt to quit: 08/06/2017    Years since quitting: 0.0  . Smokeless tobacco: Never Used  Substance and Sexual Activity  . Alcohol use: Not Currently    Comment: Hx  abuse, quit 01/2017  . Drug use: Not Currently  . Sexual activity: Not on file  Lifestyle  . Physical activity:    Days per week: Not on file    Minutes per session: Not on file  . Stress: Not on file  Relationships  . Social connections:    Talks on phone: Not on file    Gets together: Not on file    Attends religious service: Not on file    Active member of club or organization: Not on file    Attends meetings of clubs or organizations: Not on file    Relationship status: Not on file  . Intimate partner violence:    Fear of current or ex partner: Not on file    Emotionally abused: Not on file    Physically abused: Not on file    Forced sexual activity: Not on file  Other Topics Concern  . Not on file  Social History Narrative  . Not on file    Family History:   Family History  Problem Relation Age of Onset  . Cancer Mother 15       brain tumor  . Diabetes Father    Family Status:  Family Status  Relation Name Status  . Mother  Deceased  . Father  Deceased    ROS:  Please see the history of present illness.  All other ROS reviewed and negative.     Physical Exam/Data:   Vitals:   08/28/17  0849 08/28/17 0907 08/28/17 1003 08/28/17 1015  BP: 120/80 108/71 113/70 98/62  Pulse: 85 78 74 68  Resp: 18 20 (!) 22 15  Temp: 98 F (36.7 C)  98 F (36.7 C)   TempSrc: Oral  Oral   SpO2: 94% 97% 100% 100%  Weight:      Height:       No intake or output data in the 24 hours ending 08/28/17 1126 Filed Weights   08/28/17 0846  Weight: 106 lb (48.1 kg)   Body mass index is 22.15 kg/m.  General:  Well nourished, well developed, anxious, but in no acute distress HEENT: normal Lymph: no adenopathy Neck: no JVD Endocrine:  No thryomegaly Vascular: No carotid bruits; 4/4 extremity pulses 2+, without bruits  Cardiac:  normal S1, S2; RRR; no murmur  Lungs:  clear to auscultation bilaterally, no wheezing, rhonchi or rales  Abd: soft, nontender, no hepatomegaly  Ext: no edema Musculoskeletal:  No deformities, BUE and BLE strength normal and equal Skin: warm and dry  Neuro:  CNs 2-12 intact, no focal abnormalities noted Psych:  Anxious affect   EKG:  The EKG was personally reviewed and demonstrates:  05/07, SR, HR 74, ?Wellen's ST/T waves in anterior leads. Improved from 02/08/2017 Telemetry:  Telemetry was personally reviewed and demonstrates:  SR  Relevant CV Studies:  ECHO: 05/11/2017 ([facility])  LVEF < 25%, LV mildly dilated, anteroseptal, anterior, apex- akinetic.  Normal RV   ECHO: 02/12/2017 - Left ventricle: The cavity size was normal. Wall thickness was   increased in a pattern of mild LVH. Akinesis of the anteroseptal   wall and the basal to apical inferoseptal wall. Mid to apical   inferior akinesis and mid to apical anterior akinesis. Akinesis   of the true apex. Apical lateral akinesis. The estimated ejection   fraction was 20%. Doppler parameters are consistent with abnormal   left ventricular relaxation (grade 1 diastolic dysfunction). - Aortic valve: There was no stenosis. - Mitral valve: There was  no significant regurgitation. - Right ventricle: Systolic  function was mildly reduced. The apical   RV appears akinetic. - Pulmonary arteries: No complete TR doppler jet so unable to   estimate PA systolic pressure. - Systemic veins: IVC measured 1.9 cm with < 50% respirophasic   variation, suggesting RA pressure 8 mmHg. - Pericardium, extracardiac: A trivial pericardial effusion was   identified. Impressions: - Normal LV size with EF 20%. Multiple wall motion abnormalities   suggesting extensive infarction in the territory of a large LAD.   The RV was normal in size with mildly decreased systolic function   (akinesis of the RV apex noted). No significant valvular   abnormalities.  CATH: 02/11/2017  Ost LM lesion, 80 %stenosed.  2nd Mrg lesion, 85 %stenosed.  Mid Cx to Dist Cx lesion, 75 %stenosed.  LV end diastolic pressure is severely elevated.  Prox LAD lesion, 100 %stenosed.  Post intervention, there is a 20% residual stenosis.  Prox RCA to Mid RCA lesion, 60 %stenosed.  1. Ostial left main stenosis - 80% 2. 3 vessel CAD    - 100% proximal LAD.     - 85% OM2, and 75 % distal dominant LCx    - 60% nondominant RCA 3. Severely elevated LVEDP 4. Successful balloon angioplasty of the pLAD, 100%>>20%. Stent not placed due to poor outflow. I suspect this vessel has been occluded for > 12-24 hours.  5. Significant left main stenosis by IVUS criteria. Plan: optimize medical management. Assess LV function by Echo. Will need to consider revascularization with CABG depending on clinical course. Given this consideration and the fact that she did not receive a stent we did not initiate P2 Y12 inhibitor.   Post-Intervention Diagram        MR Cardiac REST WWO Contrast 08/02/2017 INTERPRETATION 1. The left ventricle is severely dilated. The calculated LVEF is 17%.  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  There is hypokinesis of the inferior basal and mid segments. The basal  and mid anterolateral and  inferolateral/inferior segments have normal  wall motion and no infarction. There is transmural delayed gadolinium  enhancementmyocardial ifnarction of the mid-distal LAD territory. No  apical thrombus noted.  2. The RV is normal in size and function.  3. All visualized valves are pliable.  4. 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 There is transmural infarct of the mid  distal LAD territory. No apical thrombus. The LCX and RCA territory  appear viable with no infarction and preserved wall motion.   Pulmonary Function Test (PFT) (Order [id number])  07/26/2017 10:10 AM  Component Results  Component Value Ref Range & Units Status Lab  FVC Pre-Actual 1.59 L MEDGRAPH  FVC %Pred-Pre 56.2 % MEDGRAPH  FVC LLN 2.23 L MEDGRAPH  FEV1 Pre-Actual 1.21 L MEDGRAPH  FEV1 %Pred-Pre 52.8 % MEDGRAPH  FEV1 LLN 1.79 L MEDGRAPH  FEV1FVC Pre-Actual 76 % MEDGRAPH  FEV1FVC LLN 71 % MEDGRAPH  SVC Pre-Actual 1.75 L MEDGRAPH  SVC %Pred-Pre 64.2 % MEDGRAPH  RVPLETH %Pred-Pre 134.9 % MEDGRAPH  TLCPLETH Pre-Actual 3.78 L MEDGRAPH  TLCPLETH %Pred-Pre 89.4 % MEDGRAPH  TLCPLETH LLN 3.15 L MEDGRAPH  DLCOUNC Pre-Actual 11.74 ml/min/mmHg MEDGRAPH  DLCOUNC %Pred-Pre 67.7 % MEDGRAPH  DLCOUNC LLN 13.36 ml/min/mmHg MEDGRAPH  DLVA %Pred-Pre 90.4 % [place]  FEV1SVC Pre-Actual 69     Laboratory Data:  Chemistry Recent Labs  Lab 08/28/17 [phone]  08/28/17 0900  NA 130* 133*  K 4.4 4.5  CL 99* 99*  CO2 23  --   GLUCOSE 93 84  BUN 17 16  CREATININE 0.62 0.70  CALCIUM 9.8  --   GFRNONAA >60  --   GFRAA >60  --   ANIONGAP 8  --     Lab Results  Component Value Date   ALT 35 02/11/2017   AST 131 (H) 02/11/2017   ALKPHOS 79 02/11/2017   BILITOT 0.7 02/11/2017   Hematology Recent Labs  Lab 08/28/17 0853 08/28/17 0900  WBC 9.3  --   RBC 4.39  --   HGB 13.1 13.9  HCT 38.9 41.0  MCV 88.6  --   MCH 29.8  --     MCHC 33.7  --   RDW 13.4  --   PLT 214  --    Cardiac Enzymes Recent Labs  Lab 08/28/17 0853  TROPONINI <0.03    Recent Labs  Lab 08/28/17 0857  TROPIPOC 0.01    BNPNo results for input(s): BNP, PROBNP in the last 168 hours.  DDimer  Recent Labs  Lab 08/28/17 1020  DDIMER 5.29*   TSH:  Lab Results  Component Value Date   TSH <0.008 (L) 12/23/2013   Lipids: Lab Results  Component Value Date   CHOL 157 08/28/2017   HDL 47 08/28/2017   LDLCALC 89 08/28/2017   TRIG 103 08/28/2017   CHOLHDL 3.3 08/28/2017   HgbA1c:No results found for: HGBA1C  Radiology/Studies:  Dg Chest Port 1 View  Result Date: 08/28/2017 CLINICAL DATA:  Chest pain and shortness of breath today, history of previous myocardial infarction, pre cardiac catheterization EXAM: PORTABLE CHEST 1 VIEW COMPARISON:  Chest x-ray of 04/06/2017 and 02/11/2017 FINDINGS: No active infiltrate or effusion is seen. Mediastinal and hilar contours are unremarkable. The heart is borderline enlarged and stable. No acute bony abnormality is seen. IMPRESSION: No active lung disease.  Stable borderline cardiomegaly. Electronically Signed   By: Dwyane Dee M.D.   On: 08/28/2017 09:38    Assessment and Plan:   Principal Problem: 1.  Left main coronary artery disease: 80% stenosis confirmed by IVUS; - Pt was to see Dr Bufford Buttner at [facility] for CHF and to determine if PCI would be performed - had been turned down for CABG as MRI showed LAD territory not viable - has chest pain and angina - sx relieved by Nitro - currently pain-free - agree w/ ASA/Heparin - continue home rx including ASA 81 mg, Coreg, Plavix, Imdur, lisinopril. Doses taken from [facility] med record. - they were only giving her Coreg 3.125 mg once a day, increase to bid - Historically, pt has not known what she was taking.  Active Problems:   Essential hypertension - BP well-controlled - no sx w/ SBP 90s - [place] med list includes Dilt 120 mg qd, but with  very low EF, will not order right now (no arrhythmia hx)    Hyperlipidemia with target LDL less than 70 - continue high-dose statin but [facility]    Chronic combined systolic (congestive) and diastolic (congestive) heart failure (HCC) - on above rx plus spiro 12.5 mg qd and dig 0.125 mg qd (ck level). - on Lasix 20 mg qd - no sig volume overload by exam, ck BNP - recent MRI, no need for echo    Hx low TSH - recheck - no dx hyperthyroid   Severity of Illness: The appropriate patient status  for this patient is OBSERVATION. Observation status is judged to be reasonable and necessary in order to provide the required intensity of service to ensure the patient's safety. The patient's presenting symptoms, physical exam findings, and initial radiographic and laboratory data in the context of their medical condition is felt to place them at decreased risk for further clinical deterioration. Furthermore, it is anticipated that the patient will be medically stable for discharge from the hospital within 2 midnights of admission. The following factors support the patient status of observation.   " The patient's presenting symptoms include chest pain. " The physical exam findings include abnl ECG. " The initial radiographic and laboratory data are stable.      For questions or updates, please contact [facility] Please consult [email or web address] for contact info under Cardiology/STEMI.   Signed, Theodore Demark, PA-C  08/28/2017 11:26 AM   I have examined the patient and reviewed assessment and plan and discussed with patient.  Agree with above as stated.  I was called to evaluate the patient as a code STEMI was called.  I examined the patient and personally reviewed the ECGs.  Her sx were not like her prior MI.  ECG was borderline, but not diagnostic of STEMI.  Initial troponin at [facility] was negative.  Code STEMI was cancelled.  Will watch on tele.  She is accompanied by an Technical sales engineer  from the sheriff's office.    Check Free T4 as well given low TSH.  Known low EF.  COntinue management of systolic dysfunction. Rule out for MI.  BNP low.  No evidence of volume overload on exam.  Turned down for CABG due to nonviable LAD territory.  Continue medical therapy at this time.   Needs high dose statin given prior MI.   Lance Muss

## 2017-08-28 NOTE — Progress Notes (Signed)
ANTICOAGULATION CONSULT NOTE - Initial Consult  Pharmacy Consult for heparin Indication: chest pain/ACS  Allergies  Allergen Reactions  . Codeine Rash  . Penicillins Rash    Has patient had a PCN reaction causing immediate rash, facial/tongue/throat swelling, SOB or lightheadedness with hypotension: No Has patient had a PCN reaction causing severe rash involving mucus membranes or skin necrosis: No Has patient had a PCN reaction that required hospitalization: No Has patient had a PCN reaction occurring within the last 10 years: No If all of the above answers are "NO", then may proceed with Cephalosporin use.    Patient Measurements: Height:  (147.3 cm) Weight: 106 lb (48.1 kg) IBW/kg (Calculated) : 40.9 Heparin Dosing Weight: 48 kg  Vital Signs: Temp: 98 F (36.7 C) (05/07 1003) Temp Source: Oral (05/07 1003) BP: 113/70 (05/07 1003) Pulse Rate: 74 (05/07 1003)  Labs: Recent Labs    08/28/17 0853 08/28/17 0900  HGB 13.1 13.9  HCT 38.9 41.0  PLT 214  --   APTT 28  --   LABPROT 12.6  --   INR 0.95  --   CREATININE 0.62 0.70  TROPONINI <0.03  --     Estimated Creatinine Clearance: 54.9 mL/min (by C-G formula based on SCr of 0.7 mg/dL).   Medical History: Past Medical History:  Diagnosis Date  . Acute ST elevation myocardial infarction (STEMI) involving LAD: 02/11/2017   Likely delayed presentation - 100% mLAD - s/p PTCA 20% (slow reflow); Also noted Severe 80% Ost LM, 75% mCx & 80% OM2 as well as ~60% non-dom RCA. Severely Elevated LVEDP  . Bicipital tenosynovitis   . Calcifying tendinitis of shoulder   . Cervical spondylosis without myelopathy   . Essential hypertension   . Hyperlipidemia with target LDL less than 70 02/12/2017  . 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 Left main coronary artery disease: 80% stenosis confirmed by IVUS; 02/11/2017   Ost LM  lesion, 80 %stenosed. 2nd Mrg lesion, 85 %stenosed. Mid Cx to Dist Cx lesion, 75 %stenosed. Prox LAD lesion, 100 %stenosed. -- PTCA: Post intervention, there is a 20% residual stenosis. Prox RCA to Mid RCA lesion, 60 %stenosed. (Non-dominant) LV end diastolic pressure is severely elevated.  . Personality change due to conditions classified elsewhere   . Tobacco use 02/12/2017    Medications:   (Not in a hospital admission)  Assessment: 50 YOF here with chest pain and ST elevation. Pharmacy consulted to start IV heparin for STEMI. She transferred from [facility] and received a bolus of IV heparin at [facility]. H/H and Plt wnl.   Goal of Therapy:  Heparin level 0.3-0.7 units/ml Monitor platelets by anticoagulation protocol: Yes   Plan:  -Start heparin infusion at 550 units/hr -F/u plans for cath.   Vinnie Level, PharmD., BCPS Clinical Pharmacist Clinical phone for 08/28/17 until 3:30pm: [phone] If after 3:30pm, please call main pharmacy at: [phone]

## 2017-08-28 NOTE — ED Provider Notes (Signed)
  Physical Exam  BP 108/72   Pulse (!) 115   Temp 98 F (36.7 C) (Oral)   Resp (!) 24   Ht  (1.473 m)   Wt 48.1 kg (106 lb)   SpO2 99%   BMI 22.15 kg/m   Physical Exam  ED Course/Procedures     Procedures  MDM  Patient seen at [facility] for STEMI. Given heparin bolus, nitro, and ASA PTA. Has about 1/10 pain on arrival. Dr. Eldridge Dace saw patient. Patient's EKG unchanged from earlier today, still shows the STEMI. She had recent cath and has known CAD and cardiology doesn't want to take her emergently to cath. Recommend more work up. She is borderline tachy, will get d-dimer.   12:55 PM D-dimer 5. CTA pending. Started on heparin drip. Cardiology to admit for r/o ACS. May cath electively.   CRITICAL CARE Performed by: Richardean Canal   Total critical care time: 30 minutes  Critical care time was exclusive of separately billable procedures and treating other patients.  Critical care was necessary to treat or prevent imminent or life-threatening deterioration.  Critical care was time spent personally by me on the following activities: development of treatment plan with patient and/or surrogate as well as nursing, discussions with consultants, evaluation of patient's response to treatment, examination of patient, obtaining history from patient or surrogate, ordering and performing treatments and interventions, ordering and review of laboratory studies, ordering and review of radiographic studies, pulse oximetry and re-evaluation of patient's condition.    Charlynne Pander, MD 08/28/17 1256

## 2017-08-28 NOTE — ED Notes (Signed)
Pt transported to CT ?

## 2017-08-29 DIAGNOSIS — I2510 Atherosclerotic heart disease of native coronary artery without angina pectoris: Secondary | ICD-10-CM | POA: Diagnosis not present

## 2017-08-29 DIAGNOSIS — I110 Hypertensive heart disease with heart failure: Secondary | ICD-10-CM | POA: Diagnosis not present

## 2017-08-29 DIAGNOSIS — I10 Essential (primary) hypertension: Secondary | ICD-10-CM | POA: Diagnosis not present

## 2017-08-29 DIAGNOSIS — I25110 Atherosclerotic heart disease of native coronary artery with unstable angina pectoris: Secondary | ICD-10-CM | POA: Diagnosis not present

## 2017-08-29 DIAGNOSIS — I200 Unstable angina: Secondary | ICD-10-CM

## 2017-08-29 DIAGNOSIS — E785 Hyperlipidemia, unspecified: Secondary | ICD-10-CM | POA: Diagnosis not present

## 2017-08-29 DIAGNOSIS — I5042 Chronic combined systolic (congestive) and diastolic (congestive) heart failure: Secondary | ICD-10-CM | POA: Diagnosis not present

## 2017-08-29 LAB — LIPID PANEL
CHOL/HDL RATIO: 3.4 ratio
Cholesterol: 146 mg/dL (ref 0–200)
HDL: 43 mg/dL (ref >40)
LDL CALC: 72 mg/dL (ref 0–99)
TRIGLYCERIDES: 157 mg/dL — AB (ref <150)
VLDL: 31 mg/dL (ref 0–40)

## 2017-08-29 LAB — TROPONIN I

## 2017-08-29 LAB — COMPREHENSIVE METABOLIC PANEL
ALBUMIN: 4.1 g/dL (ref 3.5–5.0)
ALT: 14 U/L (ref 14–54)
ANION GAP: 11 (ref 5–15)
AST: 20 U/L (ref 15–41)
Alkaline Phosphatase: 62 U/L (ref 38–126)
BUN: 21 mg/dL — ABNORMAL HIGH (ref 6–20)
CHLORIDE: 98 mmol/L — AB (ref 101–111)
CO2: 19 mmol/L — AB (ref 22–32)
Calcium: 8.9 mg/dL (ref 8.9–10.3)
Creatinine, Ser: 0.87 mg/dL (ref 0.44–1.00)
GFR calc non Af Amer: >60 mL/min (ref >60)
GLUCOSE: 113 mg/dL — AB (ref 65–99)
POTASSIUM: 4.2 mmol/L (ref 3.5–5.1)
SODIUM: 128 mmol/L — AB (ref 135–145)
Total Bilirubin: 0.4 mg/dL (ref 0.3–1.2)
Total Protein: 6.2 g/dL — ABNORMAL LOW (ref 6.5–8.1)

## 2017-08-29 LAB — T4, FREE: Free T4: 0.98 ng/dL (ref 0.82–1.77)

## 2017-08-29 LAB — HIV ANTIBODY (ROUTINE TESTING W REFLEX): HIV SCREEN 4TH GENERATION: NONREACTIVE

## 2017-08-29 MED ORDER — ALPRAZOLAM 0.25 MG PO TABS: 0.2500 mg | ORAL_TABLET | Freq: Three times a day (TID) | ORAL | 0 refills | Status: AC | PRN

## 2017-08-29 NOTE — Discharge Summary (Addendum)
Discharge Summary    Patient ID: Julie Simpson,  MRN: [id number], DOB/AGE: 50-Sep-1967 50 y.o.  Admit date: 08/28/2017 Discharge date: 08/29/2017  Primary Care Provider: Mickle Plumb Primary Cardiologist: Dr. Zachery Conch with [facility]  Discharge Diagnoses    Principal Problem:   Left main coronary artery disease: 80% stenosis confirmed by IVUS; Active Problems:   Essential hypertension   Hyperlipidemia with target LDL less than 70   Chronic combined systolic (congestive) and diastolic (congestive) heart failure (HCC)   Unstable angina (HCC)   Allergies Allergies  Allergen Reactions  . Codeine Rash  . Penicillins Rash    Has patient had a PCN reaction causing immediate rash, facial/tongue/throat swelling, SOB or lightheadedness with hypotension: No Has patient had a PCN reaction causing severe rash involving mucus membranes or skin necrosis: No Has patient had a PCN reaction that required hospitalization: No Has patient had a PCN reaction occurring within the last 10 years: No If all of the above answers are "NO", then may proceed with Cephalosporin use.    Diagnostic Studies/Procedures    None this admission _____________   History of Present Illness     Julie Simpson has been followed by Dr Zachery Conch and Dr Justice Britain in [place]. Julie Simpson underwent a cardiac MRI for viability but the LAD territory did not have viability>>CABG not planned, possible PCI per Dr Bufford Buttner but Julie Simpson has not seen him back yet.   Julie Simpson was incarcerated about 2 weeks ago. Julie Simpson had quit smoking a few days before then.   On 08/28/17, about 6:30 am, while in bed, Julie Simpson had sudden onset of chest pain. Sharp and stabbing. It went from the R to the L chest and radiated into her R arm and upper back. Worse w/ deep inspiration. Was breathing very fast. 8/10. Julie Simpson reports this is her usual chest pain. Pt got SL NTG x 2, pain improved after the 2nd tab and was 2/10 on arrival to the ER.  Julie Simpson got 324 mg ASA and IV heparin in the ER, and  was chest pain-free at the time of admission.   Hospital Course     Consultants: None   1. Chest pain with history of complicated CAD with previous MI: not a candidate for CABG. MRI showed LAD teritory is not viable. EKG on the morning of 08/29/17 c/f anterior STEMI, however patient has known prior MI in this region, has been CP free since 08/28/17 in the afternoon, and troponins negative x3. Ddimer was elevated to 5.29. CT Chest negative for PE. No indication for cardiac cath at this time.  - Continue ASA, plavix, and statin - Continue imdur and coreg (increased to BID - records from [facility] noted once daily)  2. Chronic combined CHF: appears euvolemic; no complaints of SOB/DOE - Digoxin level 0.5 - Continued on coreg, spronolactone, lisinopril, digoxin, and lasix  3. HLD:  - Continue high-dose statin  4. Low TSH: FT4 was wnl.   5. HTN: BP well controlled - Continue current regimen    _____________  Discharge Vitals Blood pressure 107/84, pulse 100, temperature 97.9 F (36.6 C), temperature source Oral, resp. rate 18, height  (1.473 m), weight 105 lb 9.6 oz (47.9 kg), SpO2 99 %.  Filed Weights   08/28/17 0846 08/28/17 2032 08/29/17 0427  Weight: 106 lb (48.1 kg) 104 lb 12.8 oz (47.5 kg) 105 lb 9.6 oz (47.9 kg)    Labs & Radiologic Studies    CBC Recent Labs    08/28/17  1191 08/28/17 0900  WBC 9.3  --   HGB 13.1 13.9  HCT 38.9 41.0  MCV 88.6  --   PLT 214  --    Basic Metabolic Panel Recent Labs    47/82/95 0853 08/28/17 0900 08/28/17 1208 08/29/17 0012  NA 130* 133*  --  128*  K 4.4 4.5  --  4.2  CL 99* 99*  --  98*  CO2 23  --   --  19*  GLUCOSE 93 84  --  113*  BUN 17 16  --  21*  CREATININE 0.62 0.70  --  0.87  CALCIUM 9.8  --   --  8.9  MG  --   --  1.9  --    Liver Function Tests Recent Labs    08/29/17 0012  AST 20  ALT 14  ALKPHOS 62  BILITOT 0.4  PROT 6.2*  ALBUMIN 4.1   No results for input(s): LIPASE, AMYLASE in  the last 72 hours. Cardiac Enzymes Recent Labs    08/28/17 1208 08/28/17 1926 08/29/17 0012  TROPONINI <0.03 <0.03 <0.03   BNP Invalid input(s): POCBNP D-Dimer Recent Labs    08/28/17 1020  DDIMER 5.29*   Hemoglobin A1C Recent Labs    08/28/17 1209  HGBA1C 5.6   Fasting Lipid Panel Recent Labs    08/29/17 0012  CHOL 146  HDL 43  LDLCALC 72  TRIG 157*  CHOLHDL 3.4   Thyroid Function Tests Recent Labs    08/28/17 1209  TSH <0.010*   _____________  Ct Angio Chest Pe W And/or Wo Contrast  Result Date: 08/28/2017 CLINICAL DATA:  Acute presentation with chest pain and shortness of breath. High pretest probability of pulmonary emboli. EXAM: CT ANGIOGRAPHY CHEST WITH CONTRAST TECHNIQUE: Multidetector CT imaging of the chest was performed using the standard protocol during bolus administration of intravenous contrast. Multiplanar CT image reconstructions and MIPs were obtained to evaluate the vascular anatomy. CONTRAST:  ISOVUE-370 IOPAMIDOL (ISOVUE-370) INJECTION 76% COMPARISON:  None. FINDINGS: Cardiovascular: Pulmonary arterial opacification is excellent. There are no pulmonary emboli. There is aortic atherosclerosis. There is coronary artery calcification. Heart size is normal. No pericardial fluid. Mediastinum/Nodes: No mass or lymphadenopathy. Lungs/Pleura: Lungs are clear.  No pleural fluid. Upper Abdomen: Negative Musculoskeletal: Curvature convex to the right. Partial compression fracture of T7, old. Review of the MIP images confirms the above findings. IMPRESSION: Negative for pulmonary emboli or other acute chest pathology. Aortic atherosclerosis. Coronary artery calcification. Old T7 fracture. Electronically Signed   By: Paulina Fusi M.D.   On: 08/28/2017 13:35   Dg Chest Port 1 View  Result Date: 08/28/2017 CLINICAL DATA:  Chest pain and shortness of breath today, history of previous myocardial infarction, pre cardiac catheterization EXAM: PORTABLE CHEST 1 VIEW  COMPARISON:  Chest x-ray of 04/06/2017 and 02/11/2017 FINDINGS: No active infiltrate or effusion is seen. Mediastinal and hilar contours are unremarkable. The heart is borderline enlarged and stable. No acute bony abnormality is seen. IMPRESSION: No active lung disease.  Stable borderline cardiomegaly. Electronically Signed   By: Dwyane Dee M.D.   On: 08/28/2017 09:38   Disposition   Patient was seen and examined by Dr. Eldridge Dace who deemed patient as stable for discharge. Follow-up has been arranged. Discharge medications as listed below.   Follow-up Plans & Appointments    Follow-up Information    Tonna Corner, MD Follow up.   Specialty:  Cardiology Why:  Please follow-up with Dr. Zachery Conch as previously scheduled.  Contact information: [address] [place] [address] [phone]            Discharge Medications   Allergies as of 08/29/2017      Reactions   Codeine Rash   Penicillins Rash   Has patient had a PCN reaction causing immediate rash, facial/tongue/throat swelling, SOB or lightheadedness with hypotension: No Has patient had a PCN reaction causing severe rash involving mucus membranes or skin necrosis: No Has patient had a PCN reaction that required hospitalization: No Has patient had a PCN reaction occurring within the last 10 years: No If all of the above answers are "NO", then may proceed with Cephalosporin use.      Medication List    STOP taking these medications   ivabradine 5 MG Tabs tablet Commonly known as:  CORLANOR   traMADol 50 MG tablet Commonly known as:  ULTRAM     TAKE these medications   albuterol (2.5 MG/3ML) 0.083% nebulizer solution Commonly known as:  PROVENTIL Take 3 mLs (2.5 mg total) by nebulization every 4 (four) hours as needed for wheezing or shortness of breath.   ALPRAZolam 0.25 MG tablet Commonly known as:  XANAX Take 1 tablet (0.25 mg total) by mouth 3 (three) times daily as needed for anxiety. What  changed:    medication strength  how much to take  when to take this  reasons to take this   aspirin 81 MG EC tablet Take 1 tablet (81 mg total) by mouth daily.   atorvastatin 80 MG tablet Commonly known as:  LIPITOR Take 1 tablet (80 mg total) by mouth daily at 6 PM.   baclofen 10 MG tablet Commonly known as:  LIORESAL Take 1 tablet (10 mg total) by mouth 3 (three) times daily as needed for muscle spasms.   carvedilol 3.125 MG tablet Commonly known as:  COREG Take 1 tablet (3.125 mg total) by mouth 2 (two) times daily with a meal.   clopidogrel 75 MG tablet Commonly known as:  PLAVIX Take 1 tablet (75 mg total) by mouth daily.   digoxin 0.125 MG tablet Commonly known as:  LANOXIN Take 1 tablet (0.125 mg total) by mouth daily.   diltiazem 120 MG 24 hr capsule Commonly known as:  CARDIZEM CD Take 120 mg by mouth every morning.   furosemide 20 MG tablet Commonly known as:  LASIX Take 40 mg by mouth daily.   isosorbide mononitrate 60 MG 24 hr tablet Commonly known as:  IMDUR Take 1 tablet (60 mg total) by mouth daily.   lisinopril 10 MG tablet Commonly known as:  PRINIVIL,ZESTRIL Take 10 mg by mouth daily.   lisinopril 2.5 MG tablet Commonly known as:  PRINIVIL,ZESTRIL Take 1 tablet (2.5 mg total) by mouth daily.   nitroGLYCERIN 0.3 MG SL tablet Commonly known as:  NITROSTAT Place 0.3 mg under the tongue See admin instructions. Place 1 tablet under the tongue every 5 (five) minutes as needed for up to 14 days for chest pain.   pantoprazole 40 MG tablet Commonly known as:  PROTONIX Take 1 tablet (40 mg total) by mouth as needed (for GERD).   sertraline 50 MG tablet Commonly known as:  ZOLOFT Take 1 tablet (50 mg total) by mouth daily.   spironolactone 25 MG tablet Commonly known as:  ALDACTONE Take 0.5 tablets (12.5 mg total) by mouth daily.   traZODone 100 MG tablet Commonly known as:  DESYREL TAKE ONE TABLET AT BEDTIME  Outstanding  Labs/Studies   None  Duration of Discharge Encounter   Greater than 30 minutes including physician time.  Signed, Beatriz Stallion PA-C 08/29/2017, 2:04 PM   I have examined the patient and reviewed assessment and plan and discussed with patient.  Agree with above as stated.   Ruled out for MI.  Julie Simpson does not have sx of heart failure.  Julie Simpson feels back to baseline today.  It was confirmed that Julie Simpson is not in police custody and can be released home.    F/u with her regular cardiologist at [facility]. Julie Simpson mentioned that transplant was being considered.  Lance Muss

## 2017-08-29 NOTE — Progress Notes (Signed)
   Discussed with [facility] that patient was released from custody effective 08/28/17. She can be discharged home.

## 2017-08-29 NOTE — Care Management Obs Status (Signed)
MEDICARE OBSERVATION STATUS NOTIFICATION   Patient Details  Name: Julie Simpson MRN: [id number] Date of Birth: 1965-10-28   Medicare Observation Status Notification Given:  Yes    Lawerance Sabal, RN 08/29/2017, 2:09 PM

## 2017-08-29 NOTE — Progress Notes (Signed)
EKG CRITICAL VALUE     12 lead EKG performed.  Critical value noted.  Balinda Quails, RN notified.   [place], CCT 08/29/2017 8:00 AM

## 2017-08-29 NOTE — Progress Notes (Addendum)
Progress Note  Patient Name: Julie Simpson Date of Encounter: 08/29/2017  Primary Cardiologist: Dr. Zachery Conch with [facility]  Subjective   Feeling back to usual this morning. No chest pain since yesterday afternoon. No complaints of SOB or palpitations.   Inpatient Medications    Scheduled Meds: . aspirin EC  81 mg Oral Daily  . carvedilol  3.125 mg Oral BID WC  . clopidogrel  75 mg Oral Daily  . digoxin  0.125 mg Oral Daily  . furosemide  20 mg Oral Daily  . isosorbide mononitrate  60 mg Oral Daily  . sertraline  50 mg Oral Daily  . sodium chloride flush  3 mL Intravenous Q12H  . spironolactone  12.5 mg Oral Daily   Continuous Infusions: . sodium chloride Stopped (08/28/17 1017)  . sodium chloride    . heparin 550 Units/hr (08/29/17 0600)   PRN Meds: sodium chloride, acetaminophen, ALPRAZolam, nitroGLYCERIN, ondansetron (ZOFRAN) IV, sodium chloride flush, zolpidem   Vital Signs    Vitals:   08/28/17 2320 08/29/17 0314 08/29/17 0427 08/29/17 0700  BP: 117/67 (!) 115/48  103/73  Pulse: 76 75  92  Resp: (!) Temp: 99.2 F (37.3 C) 97.7 F (36.5 C)  98 F (36.7 C)  TempSrc: Oral Oral  Oral  SpO2: 98% 100%  100%  Weight:   105 lb 9.6 oz (47.9 kg)   Height:        Intake/Output Summary (Last 24 hours) at 08/29/2017 0737 Last data filed at 08/29/2017 0600 Gross per 24 hour  Intake 833.63 ml  Output 500 ml  Net 333.63 ml   Filed Weights   08/28/17 0846 08/28/17 2032 08/29/17 0427  Weight: 106 lb (48.1 kg) 104 lb 12.8 oz (47.5 kg) 105 lb 9.6 oz (47.9 kg)    Telemetry    Sinus rhythm - Personally Reviewed  ECG    Sinus rhythm, rate 76,  with anterolateral STE, slightly more elevated than yesterday.  - Personally Reviewed  Physical Exam   GEN: laying in bed in no acute distress.   Neck: No JVD, no carotid bruits Cardiac: RRR, no murmurs, rubs, or gallops.  Respiratory: Clear to auscultation bilaterally, no wheezes/ rales/ rhonchi GI: NABS, Soft,  nontender, non-distended  MS: No edema; No deformity. Neuro:  Nonfocal, moving all extremities spontaneously Psych: Normal affect   Labs    Chemistry Recent Labs  Lab 08/28/17 0853 08/28/17 0900 08/29/17 0012  NA 130* 133* 128*  K 4.4 4.5 4.2  CL 99* 99* 98*  CO2 23  --  19*  GLUCOSE 93 84 113*  BUN 17 16 21*  CREATININE 0.62 0.70 0.87  CALCIUM 9.8  --  8.9  PROT  --   --  6.2*  ALBUMIN  --   --  4.1  AST  --   --  20  ALT  --   --  14  ALKPHOS  --   --  62  BILITOT  --   --  0.4  GFRNONAA >60  --  >60  GFRAA >60  --  >60  ANIONGAP 8  --  11     Hematology Recent Labs  Lab 08/28/17 0853 08/28/17 0900  WBC 9.3  --   RBC 4.39  --   HGB 13.1 13.9  HCT 38.9 41.0  MCV 88.6  --   MCH 29.8  --   MCHC 33.7  --   RDW 13.4  --   PLT 214  --  Cardiac Enzymes Recent Labs  Lab 08/28/17 0853 08/28/17 1208 08/28/17 1926 08/29/17 0012  TROPONINI <0.03 <0.03 <0.03 <0.03    Recent Labs  Lab 08/28/17 0857 08/28/17 1158  TROPIPOC 0.01 0.01     BNP Recent Labs  Lab 08/28/17 1209  BNP 110.1*     DDimer  Recent Labs  Lab 08/28/17 1020  DDIMER 5.29*     Radiology    Ct Angio Chest Pe W And/or Wo Contrast  Result Date: 08/28/2017 CLINICAL DATA:  Acute presentation with chest pain and shortness of breath. High pretest probability of pulmonary emboli. EXAM: CT ANGIOGRAPHY CHEST WITH CONTRAST TECHNIQUE: Multidetector CT imaging of the chest was performed using the standard protocol during bolus administration of intravenous contrast. Multiplanar CT image reconstructions and MIPs were obtained to evaluate the vascular anatomy. CONTRAST:  ISOVUE-370 IOPAMIDOL (ISOVUE-370) INJECTION 76% COMPARISON:  None. FINDINGS: Cardiovascular: Pulmonary arterial opacification is excellent. There are no pulmonary emboli. There is aortic atherosclerosis. There is coronary artery calcification. Heart size is normal. No pericardial fluid. Mediastinum/Nodes: No mass or  lymphadenopathy. Lungs/Pleura: Lungs are clear.  No pleural fluid. Upper Abdomen: Negative Musculoskeletal: Curvature convex to the right. Partial compression fracture of T7, old. Review of the MIP images confirms the above findings. IMPRESSION: Negative for pulmonary emboli or other acute chest pathology. Aortic atherosclerosis. Coronary artery calcification. Old T7 fracture. Electronically Signed   By: Paulina Fusi M.D.   On: 08/28/2017 13:35   Dg Chest Port 1 View  Result Date: 08/28/2017 CLINICAL DATA:  Chest pain and shortness of breath today, history of previous myocardial infarction, pre cardiac catheterization EXAM: PORTABLE CHEST 1 VIEW COMPARISON:  Chest x-ray of 04/06/2017 and 02/11/2017 FINDINGS: No active infiltrate or effusion is seen. Mediastinal and hilar contours are unremarkable. The heart is borderline enlarged and stable. No acute bony abnormality is seen. IMPRESSION: No active lung disease.  Stable borderline cardiomegaly. Electronically Signed   By: Dwyane Dee M.D.   On: 08/28/2017 09:38    Cardiac Studies    ECHO: 05/11/2017 ([facility])  LVEF < 25%, LV mildly dilated, anteroseptal, anterior, apex- akinetic.  Normal RV   ECHO: 02/12/2017 - Left ventricle: The cavity size was normal. Wall thickness was increased in a pattern of mild LVH. Akinesis of the anteroseptal wall and the basal to apical inferoseptal wall. Mid to apical inferior akinesis and mid to apical anterior akinesis. Akinesis of the true apex. Apical lateral akinesis. The estimated ejection fraction was 20%. Doppler parameters are consistent with abnormal left ventricular relaxation (grade 1 diastolic dysfunction). - Aortic valve: There was no stenosis. - Mitral valve: There was no significant regurgitation. - Right ventricle: Systolic function was mildly reduced. The apical RV appears akinetic. - Pulmonary arteries: No complete TR doppler jet so unable to estimate PA systolic pressure. -  Systemic veins: IVC measured 1.9 cm with < 50% respirophasic variation, suggesting RA pressure 8 mmHg. - Pericardium, extracardiac: A trivial pericardial effusion was identified. Impressions: - Normal LV size with EF 20%. Multiple wall motion abnormalities suggesting extensive infarction in the territory of a large LAD. The RV was normal in size with mildly decreased systolic function (akinesis of the RV apex noted). No significant valvular abnormalities.  CATH: 02/11/2017  Ost LM lesion, 80 %stenosed.  2nd Mrg lesion, 85 %stenosed.  Mid Cx to Dist Cx lesion, 75 %stenosed.  LV end diastolic pressure is severely elevated.  Prox LAD lesion, 100 %stenosed.  Post intervention, there is a 20% residual stenosis.  Prox  RCA to Mid RCA lesion, 60 %stenosed. 1. Ostial left main stenosis - 80% 2. 3 vessel CAD - 100% proximal LAD.  - 85% OM2, and 75 % distal dominant LCx - 60% nondominant RCA 3. Severely elevated LVEDP 4. Successful balloon angioplasty of the pLAD, 100%>>20%. Stent not placed due to poor outflow. I suspect this vessel has been occluded for >12-24 hours.  5. Significant left main stenosis by IVUS criteria. Plan: optimize medical management. Assess LV function by Echo. Will need to consider revascularization with CABG depending on clinical course. Given this consideration and the fact that she did not receive a stent we did not initiate P2 Y12 inhibitor.   MR Cardiac REST WWO Contrast 08/02/2017 INTERPRETATION 1. The left ventricle is severely dilated. The calculated LVEF is 17%.  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  There is hypokinesis of the inferior basal and mid segments. The basal  and mid anterolateral and inferolateral/inferior segments have normal  wall motion and no infarction. There is transmural delayed gadolinium  enhancementmyocardial ifnarction of the mid-distal LAD territory. No  apical thrombus  noted.  2. The RV is normal in size and function.  3. All visualized valves are pliable.  4. 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 There is transmural infarct of the mid  distal LAD territory. No apical thrombus. The LCX and RCA territory  appear viable with no infarction and preserved wall motion.     Patient Profile     52 y.o. female with a hx of late-presenting STEMI 01/2017 w/ PTCA LAD (poor distal flow) and 80% dLMain (not CABG candidate), S-D-CHF, tob use, HTN, HLD, previous intracranial injury, who presented with chest pain  Assessment & Plan    1. Complicated CAD history with previous MI: not a candidate for CABG. MRI showed LAD teritory is not viable. EKG this AM c/f anterior STEMI, however patient has known prior MI in this region, has been CP free since yesterday afternoon, and troponins negative.  - Continue ASA, plavix, and statin - Continue imdur and coreg (increased to BID - records from [facility] noted once daily) - Anticipate stopping heparin gtt later this morning  - No need for cardiac cath at this time  2. Chronic combined CHF: appears euvolemic; no complaints of SOB/DOE - Digoxin level 0.5 - Continue coreg, spronolactone, lisinopril, digoxin, and lasix  3. HLD:  - Continue high-dose statin  4. Low TSH: has been low on previous admissions although no documented history of thyroid dysfunction - Will check FT4  5. HTN: BP well controlled - Continue current regimen    For questions or updates, please contact [facility] Please consult [email or web address] for contact info under Cardiology/STEMI.      Signed, Beatriz Stallion, PA-C  08/29/2017, 7:37 AM   [phone]  I have examined the patient and reviewed assessment and plan and discussed with patient.  Agree with above as stated.  Ruled out.  Feeling better.  SHe feels she can go home.  She has f/u  with her regular cardiologist at [facility].  PLan discharge today on her usual medicines.   Lance Muss

## 2017-08-29 NOTE — Progress Notes (Signed)
Patient discharged home- confirmed patient is not incarcerated. IVs removed and paperwork reviewed, all questions answered. Patient wheeled to ED entrance.

## 2017-11-22 ENCOUNTER — Inpatient Hospital Stay (HOSPITAL_COMMUNITY): Payer: Medicare Other

## 2017-11-22 ENCOUNTER — Inpatient Hospital Stay (HOSPITAL_COMMUNITY)
Admission: EM | Admit: 2017-11-22 | Discharge: 2017-12-23 | DRG: 291 | Disposition: E | Payer: Medicare Other | Attending: Pulmonary Disease | Admitting: Pulmonary Disease

## 2017-11-22 ENCOUNTER — Emergency Department (HOSPITAL_COMMUNITY): Payer: Medicare Other

## 2017-11-22 ENCOUNTER — Encounter (HOSPITAL_COMMUNITY): Admission: EM | Disposition: E | Payer: Self-pay | Source: Home / Self Care | Attending: Pulmonary Disease

## 2017-11-22 DIAGNOSIS — I5023 Acute on chronic systolic (congestive) heart failure: Secondary | ICD-10-CM | POA: Diagnosis present

## 2017-11-22 DIAGNOSIS — I110 Hypertensive heart disease with heart failure: Principal | ICD-10-CM | POA: Diagnosis present

## 2017-11-22 DIAGNOSIS — R739 Hyperglycemia, unspecified: Secondary | ICD-10-CM | POA: Diagnosis present

## 2017-11-22 DIAGNOSIS — Z66 Do not resuscitate: Secondary | ICD-10-CM | POA: Diagnosis not present

## 2017-11-22 DIAGNOSIS — I469 Cardiac arrest, cause unspecified: Secondary | ICD-10-CM | POA: Diagnosis present

## 2017-11-22 DIAGNOSIS — R9401 Abnormal electroencephalogram [EEG]: Secondary | ICD-10-CM | POA: Diagnosis present

## 2017-11-22 DIAGNOSIS — J449 Chronic obstructive pulmonary disease, unspecified: Secondary | ICD-10-CM | POA: Diagnosis present

## 2017-11-22 DIAGNOSIS — I252 Old myocardial infarction: Secondary | ICD-10-CM | POA: Diagnosis not present

## 2017-11-22 DIAGNOSIS — J9690 Respiratory failure, unspecified, unspecified whether with hypoxia or hypercapnia: Secondary | ICD-10-CM | POA: Diagnosis not present

## 2017-11-22 DIAGNOSIS — I959 Hypotension, unspecified: Secondary | ICD-10-CM | POA: Diagnosis present

## 2017-11-22 DIAGNOSIS — I25118 Atherosclerotic heart disease of native coronary artery with other forms of angina pectoris: Secondary | ICD-10-CM

## 2017-11-22 DIAGNOSIS — N179 Acute kidney failure, unspecified: Secondary | ICD-10-CM | POA: Diagnosis present

## 2017-11-22 DIAGNOSIS — J9601 Acute respiratory failure with hypoxia: Secondary | ICD-10-CM | POA: Diagnosis present

## 2017-11-22 DIAGNOSIS — I255 Ischemic cardiomyopathy: Secondary | ICD-10-CM | POA: Diagnosis present

## 2017-11-22 DIAGNOSIS — Z808 Family history of malignant neoplasm of other organs or systems: Secondary | ICD-10-CM | POA: Diagnosis not present

## 2017-11-22 DIAGNOSIS — Z9861 Coronary angioplasty status: Secondary | ICD-10-CM

## 2017-11-22 DIAGNOSIS — E870 Hyperosmolality and hypernatremia: Secondary | ICD-10-CM | POA: Diagnosis present

## 2017-11-22 DIAGNOSIS — Z515 Encounter for palliative care: Secondary | ICD-10-CM | POA: Diagnosis not present

## 2017-11-22 DIAGNOSIS — Z87891 Personal history of nicotine dependence: Secondary | ICD-10-CM

## 2017-11-22 DIAGNOSIS — Z720 Tobacco use: Secondary | ICD-10-CM | POA: Diagnosis not present

## 2017-11-22 DIAGNOSIS — I462 Cardiac arrest due to underlying cardiac condition: Secondary | ICD-10-CM | POA: Diagnosis present

## 2017-11-22 DIAGNOSIS — D689 Coagulation defect, unspecified: Secondary | ICD-10-CM | POA: Diagnosis present

## 2017-11-22 DIAGNOSIS — E872 Acidosis: Secondary | ICD-10-CM | POA: Diagnosis present

## 2017-11-22 DIAGNOSIS — Z833 Family history of diabetes mellitus: Secondary | ICD-10-CM | POA: Diagnosis not present

## 2017-11-22 DIAGNOSIS — R0602 Shortness of breath: Secondary | ICD-10-CM

## 2017-11-22 DIAGNOSIS — I2510 Atherosclerotic heart disease of native coronary artery without angina pectoris: Secondary | ICD-10-CM | POA: Diagnosis present

## 2017-11-22 DIAGNOSIS — J9602 Acute respiratory failure with hypercapnia: Secondary | ICD-10-CM | POA: Diagnosis present

## 2017-11-22 LAB — CBC
HCT: 32.4 % — ABNORMAL LOW (ref 36.0–46.0)
Hemoglobin: 10.3 g/dL — ABNORMAL LOW (ref 12.0–15.0)
MCH: 31.2 pg (ref 26.0–34.0)
MCHC: 31.8 g/dL (ref 30.0–36.0)
MCV: 98.2 fL (ref 78.0–100.0)
PLATELETS: 197 10*3/uL (ref 150–400)
RBC: 3.3 MIL/uL — AB (ref 3.87–5.11)
RDW: 12.7 % (ref 11.5–15.5)
WBC: 29.7 10*3/uL — AB (ref 4.0–10.5)

## 2017-11-22 LAB — URINALYSIS, ROUTINE W REFLEX MICROSCOPIC
BILIRUBIN URINE: NEGATIVE
GLUCOSE, UA: 50 mg/dL — AB
KETONES UR: NEGATIVE mg/dL
LEUKOCYTES UA: NEGATIVE
NITRITE: NEGATIVE
PH: 6 (ref 5.0–8.0)
Protein, ur: 30 mg/dL — AB
SPECIFIC GRAVITY, URINE: 1.006 (ref 1.005–1.030)

## 2017-11-22 LAB — POCT I-STAT, CHEM 8
BUN: 11 mg/dL (ref 6–20)
BUN: 14 mg/dL (ref 6–20)
BUN: 16 mg/dL (ref 6–20)
CALCIUM ION: 1.02 mmol/L — AB (ref 1.15–1.40)
CHLORIDE: 106 mmol/L (ref 98–111)
Calcium, Ion: 0.97 mmol/L — ABNORMAL LOW (ref 1.15–1.40)
Calcium, Ion: 1.03 mmol/L — ABNORMAL LOW (ref 1.15–1.40)
Chloride: 104 mmol/L (ref 98–111)
Chloride: 102 mmol/L (ref 98–111)
Creatinine, Ser: 1.3 mg/dL — ABNORMAL HIGH (ref 0.44–1.00)
Creatinine, Ser: 1.3 mg/dL — ABNORMAL HIGH (ref 0.44–1.00)
Creatinine, Ser: 1.3 mg/dL — ABNORMAL HIGH (ref 0.44–1.00)
Glucose, Bld: 237 mg/dL — ABNORMAL HIGH (ref 70–99)
Glucose, Bld: 308 mg/dL — ABNORMAL HIGH (ref 70–99)
Glucose, Bld: 277 mg/dL — ABNORMAL HIGH (ref 70–99)
HEMATOCRIT: 30 % — AB (ref 36.0–46.0)
HEMATOCRIT: 28 % — AB (ref 36.0–46.0)
HEMATOCRIT: 30 % — AB (ref 36.0–46.0)
HEMOGLOBIN: 9.5 g/dL — AB (ref 12.0–15.0)
HEMOGLOBIN: 10.2 g/dL — AB (ref 12.0–15.0)
Hemoglobin: 10.2 g/dL — ABNORMAL LOW (ref 12.0–15.0)
POTASSIUM: 3 mmol/L — AB (ref 3.5–5.1)
Potassium: 4 mmol/L (ref 3.5–5.1)
Potassium: 3.4 mmol/L — ABNORMAL LOW (ref 3.5–5.1)
SODIUM: 144 mmol/L (ref 135–145)
SODIUM: 142 mmol/L (ref 135–145)
SODIUM: 145 mmol/L (ref 135–145)
TCO2: 21 mmol/L — ABNORMAL LOW (ref 22–32)
TCO2: 22 mmol/L (ref 22–32)
TCO2: 24 mmol/L (ref 22–32)

## 2017-11-22 LAB — ECHOCARDIOGRAM COMPLETE
Height: 58 in
WEIGHTICAEL: 1749.57 [oz_av]

## 2017-11-22 LAB — COMPREHENSIVE METABOLIC PANEL
ALBUMIN: 2 g/dL — AB (ref 3.5–5.0)
ALK PHOS: 63 U/L (ref 38–126)
ALT: 28 U/L (ref 0–44)
ANION GAP: 19 — AB (ref 5–15)
AST: 56 U/L — ABNORMAL HIGH (ref 15–41)
BILIRUBIN TOTAL: 0.3 mg/dL (ref 0.3–1.2)
BUN: 11 mg/dL (ref 6–20)
CALCIUM: 7.3 mg/dL — AB (ref 8.9–10.3)
CO2: 11 mmol/L — ABNORMAL LOW (ref 22–32)
Chloride: 116 mmol/L — ABNORMAL HIGH (ref 98–111)
Creatinine, Ser: 1.24 mg/dL — ABNORMAL HIGH (ref 0.44–1.00)
GFR calc Af Amer: 58 mL/min — ABNORMAL LOW (ref >60)
GFR, EST NON AFRICAN AMERICAN: 50 mL/min — AB (ref >60)
GLUCOSE: 302 mg/dL — AB (ref 70–99)
Potassium: 4.5 mmol/L (ref 3.5–5.1)
SODIUM: 146 mmol/L — AB (ref 135–145)
TOTAL PROTEIN: 3.5 g/dL — AB (ref 6.5–8.1)

## 2017-11-22 LAB — GLUCOSE, CAPILLARY
GLUCOSE-CAPILLARY: 261 mg/dL — AB (ref 70–99)
GLUCOSE-CAPILLARY: 241 mg/dL — AB (ref 70–99)
GLUCOSE-CAPILLARY: 208 mg/dL — AB (ref 70–99)
GLUCOSE-CAPILLARY: 283 mg/dL — AB (ref 70–99)
GLUCOSE-CAPILLARY: 268 mg/dL — AB (ref 70–99)
Glucose-Capillary: 318 mg/dL — ABNORMAL HIGH (ref 70–99)
Glucose-Capillary: 254 mg/dL — ABNORMAL HIGH (ref 70–99)
Glucose-Capillary: 234 mg/dL — ABNORMAL HIGH (ref 70–99)
Glucose-Capillary: 202 mg/dL — ABNORMAL HIGH (ref 70–99)

## 2017-11-22 LAB — LACTIC ACID, PLASMA
Lactic Acid, Venous: 8.9 mmol/L (ref 0.5–1.9)
Lactic Acid, Venous: 5.1 mmol/L (ref 0.5–1.9)

## 2017-11-22 LAB — PROTIME-INR
INR: 2.06
INR: 2.51
Prothrombin Time: 23.1 seconds — ABNORMAL HIGH (ref 11.4–15.2)
Prothrombin Time: 26.9 seconds — ABNORMAL HIGH (ref 11.4–15.2)

## 2017-11-22 LAB — PHOSPHORUS: Phosphorus: 8.3 mg/dL — ABNORMAL HIGH (ref 2.5–4.6)

## 2017-11-22 LAB — CBC WITH DIFFERENTIAL/PLATELET
BASOS PCT: 1 %
Basophils Absolute: 0.1 10*3/uL (ref 0.0–0.1)
Eosinophils Absolute: 0.1 10*3/uL (ref 0.0–0.7)
Eosinophils Relative: 1 %
HCT: 35.9 % — ABNORMAL LOW (ref 36.0–46.0)
Hemoglobin: 10 g/dL — ABNORMAL LOW (ref 12.0–15.0)
Lymphocytes Relative: 51 %
Lymphs Abs: 6.5 10*3/uL — ABNORMAL HIGH (ref 0.7–4.0)
MCH: 30.6 pg (ref 26.0–34.0)
MCHC: 27.9 g/dL — ABNORMAL LOW (ref 30.0–36.0)
MCV: 109.8 fL — AB (ref 78.0–100.0)
MONO ABS: 0.5 10*3/uL (ref 0.1–1.0)
Monocytes Relative: 4 %
NEUTROS ABS: 5.5 10*3/uL (ref 1.7–7.7)
Neutrophils Relative %: 43 %
Platelets: 155 10*3/uL (ref 150–400)
RBC: 3.27 MIL/uL — ABNORMAL LOW (ref 3.87–5.11)
RDW: 12.5 % (ref 11.5–15.5)
WBC: 12.7 10*3/uL — ABNORMAL HIGH (ref 4.0–10.5)

## 2017-11-22 LAB — I-STAT ARTERIAL BLOOD GAS, ED
Acid-base deficit: 22 mmol/L — ABNORMAL HIGH (ref 0.0–2.0)
BICARBONATE: 11.8 mmol/L — AB (ref 20.0–28.0)
O2 SAT: 95 %
PCO2 ART: 70.4 mmHg — AB (ref 32.0–48.0)
PO2 ART: 137 mmHg — AB (ref 83.0–108.0)
TCO2: 14 mmol/L — ABNORMAL LOW (ref 22–32)
pH, Arterial: 6.832 — CL (ref 7.350–7.450)

## 2017-11-22 LAB — DIGOXIN LEVEL: Digoxin Level: 0.2 ng/mL — ABNORMAL LOW (ref 0.8–2.0)

## 2017-11-22 LAB — PATHOLOGIST SMEAR REVIEW

## 2017-11-22 LAB — APTT: aPTT: >200 seconds (ref 24–36)

## 2017-11-22 LAB — TROPONIN I
TROPONIN I: 0.38 ng/mL — AB (ref <0.03)
Troponin I: 0.1 ng/mL (ref <0.03)
Troponin I: 0.52 ng/mL (ref <0.03)
Troponin I: 0.56 ng/mL (ref <0.03)

## 2017-11-22 LAB — POCT I-STAT 3, ART BLOOD GAS (G3+)
ACID-BASE DEFICIT: 8 mmol/L — AB (ref 0.0–2.0)
Acid-base deficit: 11 mmol/L — ABNORMAL HIGH (ref 0.0–2.0)
Bicarbonate: 19.5 mmol/L — ABNORMAL LOW (ref 20.0–28.0)
Bicarbonate: 22.4 mmol/L (ref 20.0–28.0)
O2 SAT: 93 %
O2 Saturation: 81 %
PCO2 ART: 54.5 mmHg — AB (ref 32.0–48.0)
PCO2 ART: 61 mmHg — AB (ref 32.0–48.0)
PH ART: 7.128 — AB (ref 7.350–7.450)
PO2 ART: 47 mmHg — AB (ref 83.0–108.0)
Patient temperature: 31.7
Patient temperature: 32.9
TCO2: 22 mmol/L (ref 22–32)
TCO2: 25 mmol/L (ref 22–32)
pH, Arterial: 7.147 — CL (ref 7.350–7.450)
pO2, Arterial: 67 mmHg — ABNORMAL LOW (ref 83.0–108.0)

## 2017-11-22 LAB — MRSA PCR SCREENING: MRSA by PCR: NEGATIVE

## 2017-11-22 LAB — I-STAT CHEM 8, ED
BUN: 11 mg/dL (ref 6–20)
CALCIUM ION: 0.99 mmol/L — AB (ref 1.15–1.40)
CREATININE: 1 mg/dL (ref 0.44–1.00)
Chloride: 111 mmol/L (ref 98–111)
GLUCOSE: 280 mg/dL — AB (ref 70–99)
HCT: 28 % — ABNORMAL LOW (ref 36.0–46.0)
Hemoglobin: 9.5 g/dL — ABNORMAL LOW (ref 12.0–15.0)
Potassium: 4.5 mmol/L (ref 3.5–5.1)
Sodium: 144 mmol/L (ref 135–145)
TCO2: 14 mmol/L — AB (ref 22–32)

## 2017-11-22 LAB — I-STAT TROPONIN, ED: Troponin i, poc: 0.15 ng/mL (ref 0.00–0.08)

## 2017-11-22 LAB — PROCALCITONIN: PROCALCITONIN: 0.15 ng/mL

## 2017-11-22 LAB — TYPE AND SCREEN
ABO/RH(D): A POS
ANTIBODY SCREEN: NEGATIVE

## 2017-11-22 LAB — I-STAT CG4 LACTIC ACID, ED: Lactic Acid, Venous: 14.85 mmol/L (ref 0.5–1.9)

## 2017-11-22 LAB — ABO/RH: ABO/RH(D): A POS

## 2017-11-22 LAB — MAGNESIUM: MAGNESIUM: 1.8 mg/dL (ref 1.7–2.4)

## 2017-11-22 LAB — BRAIN NATRIURETIC PEPTIDE: B Natriuretic Peptide: 797.6 pg/mL — ABNORMAL HIGH (ref 0.0–100.0)

## 2017-11-22 SURGERY — CORONARY/GRAFT ACUTE MI REVASCULARIZATION
Anesthesia: LOCAL

## 2017-11-22 MED ORDER — SODIUM CHLORIDE 0.9 % IV SOLN
1.0000 g | INTRAVENOUS | Status: DC
  Administered 2017-11-22: 1 g via INTRAVENOUS
  Filled 2017-11-22: qty 1

## 2017-11-22 MED ORDER — NOREPINEPHRINE 16 MG/250ML-% IV SOLN
0.0000 ug/min | INTRAVENOUS | Status: DC
  Administered 2017-11-22 (×2): 50 ug/min via INTRAVENOUS
  Filled 2017-11-22 (×2): qty 250

## 2017-11-22 MED ORDER — SODIUM BICARBONATE 8.4 % IV SOLN
100.0000 meq | Freq: Once | INTRAVENOUS | Status: AC
  Administered 2017-11-22: 100 meq via INTRAVENOUS
  Filled 2017-11-22: qty 50

## 2017-11-22 MED ORDER — CHLORHEXIDINE GLUCONATE 0.12% ORAL RINSE (MEDLINE KIT)
15.0000 mL | Freq: Two times a day (BID) | OROMUCOSAL | Status: DC
  Administered 2017-11-22: 15 mL via OROMUCOSAL

## 2017-11-22 MED ORDER — HEPARIN BOLUS VIA INFUSION
3000.0000 [IU] | Freq: Once | INTRAVENOUS | Status: AC
  Administered 2017-11-22: 3000 [IU] via INTRAVENOUS
  Filled 2017-11-22: qty 3000

## 2017-11-22 MED ORDER — SODIUM CHLORIDE 0.9% FLUSH
10.0000 mL | INTRAVENOUS | Status: DC | PRN
  Administered 2017-11-22: 40 mL
  Filled 2017-11-22: qty 40

## 2017-11-22 MED ORDER — NOREPINEPHRINE 4 MG/250ML-% IV SOLN
0.0000 ug/min | Freq: Once | INTRAVENOUS | Status: AC
  Administered 2017-11-22: 5 ug/min via INTRAVENOUS
  Filled 2017-11-22: qty 250

## 2017-11-22 MED ORDER — SODIUM BICARBONATE 8.4 % IV SOLN
INTRAVENOUS | Status: DC
  Administered 2017-11-22: 09:00:00 via INTRAVENOUS
  Filled 2017-11-22 (×2): qty 150

## 2017-11-22 MED ORDER — SODIUM CHLORIDE 0.9 % IV BOLUS
1000.0000 mL | Freq: Once | INTRAVENOUS | Status: AC
  Administered 2017-11-22: 1000 mL via INTRAVENOUS

## 2017-11-22 MED ORDER — PROTAMINE SULFATE 10 MG/ML IV SOLN
50.0000 mg | Freq: Once | INTRAVENOUS | Status: AC
  Administered 2017-11-22: 50 mg via INTRAVENOUS
  Filled 2017-11-22: qty 5

## 2017-11-22 MED ORDER — POTASSIUM CHLORIDE 10 MEQ/50ML IV SOLN
10.0000 meq | INTRAVENOUS | Status: AC
  Administered 2017-11-22 (×2): 10 meq via INTRAVENOUS
  Filled 2017-11-22 (×4): qty 50

## 2017-11-22 MED ORDER — CISATRACURIUM BOLUS VIA INFUSION
0.0500 mg/kg | INTRAVENOUS | Status: DC | PRN
  Filled 2017-11-22: qty 3

## 2017-11-22 MED ORDER — SODIUM BICARBONATE 8.4 % IV SOLN
INTRAVENOUS | Status: AC
Start: 2017-11-22 — End: 2017-11-22
  Administered 2017-11-22: 100 meq via INTRAVENOUS
  Filled 2017-11-22: qty 100

## 2017-11-22 MED ORDER — FENTANYL 2500MCG IN NS 250ML (10MCG/ML) PREMIX INFUSION: 100.0000 ug/h | INTRAVENOUS | Status: DC

## 2017-11-22 MED ORDER — SODIUM CHLORIDE 0.9 % IV SOLN
10.0000 mg/h | INTRAVENOUS | Status: DC
  Filled 2017-11-22: qty 10

## 2017-11-22 MED ORDER — HEPARIN (PORCINE) IN NACL 100-0.45 UNIT/ML-% IJ SOLN
550.0000 [IU]/h | INTRAMUSCULAR | Status: DC
  Administered 2017-11-22: 550 [IU]/h via INTRAVENOUS
  Filled 2017-11-22: qty 250

## 2017-11-22 MED ORDER — ARTIFICIAL TEARS OPHTHALMIC OINT
1.0000 "application " | TOPICAL_OINTMENT | Freq: Three times a day (TID) | OPHTHALMIC | Status: DC
  Administered 2017-11-22 (×2): 1 via OPHTHALMIC
  Filled 2017-11-22: qty 3.5

## 2017-11-22 MED ORDER — MIDAZOLAM HCL 2 MG/2ML IJ SOLN
2.0000 mg | Freq: Once | INTRAMUSCULAR | Status: AC
  Administered 2017-11-22: 2 mg via INTRAVENOUS
  Filled 2017-11-22: qty 2

## 2017-11-22 MED ORDER — INSULIN ASPART 100 UNIT/ML ~~LOC~~ SOLN
2.0000 [IU] | SUBCUTANEOUS | Status: DC
  Administered 2017-11-22: 8 [IU] via SUBCUTANEOUS

## 2017-11-22 MED ORDER — SODIUM CHLORIDE 0.9 % IV SOLN
INTRAVENOUS | Status: DC
  Administered 2017-11-22: 10:00:00 via INTRAVENOUS

## 2017-11-22 MED ORDER — FAMOTIDINE IN NACL 20-0.9 MG/50ML-% IV SOLN
20.0000 mg | Freq: Two times a day (BID) | INTRAVENOUS | Status: DC
  Administered 2017-11-22: 20 mg via INTRAVENOUS
  Filled 2017-11-22: qty 50

## 2017-11-22 MED ORDER — SODIUM BICARBONATE 8.4 % IV SOLN
100.0000 meq | Freq: Once | INTRAVENOUS | Status: AC
  Administered 2017-11-22: 100 meq via INTRAVENOUS

## 2017-11-22 MED ORDER — MIDAZOLAM BOLUS VIA INFUSION
2.0000 mg | INTRAVENOUS | Status: DC | PRN
  Filled 2017-11-22: qty 2

## 2017-11-22 MED ORDER — SODIUM CHLORIDE 0.9% FLUSH: 10.0000 mL | Freq: Two times a day (BID) | INTRAVENOUS | Status: DC

## 2017-11-22 MED ORDER — CHLORHEXIDINE GLUCONATE CLOTH 2 % EX PADS
6.0000 | MEDICATED_PAD | Freq: Every day | CUTANEOUS | Status: DC
  Administered 2017-11-22: 6 via TOPICAL

## 2017-11-22 MED ORDER — SODIUM BICARBONATE 8.4 % IV SOLN
50.0000 meq | Freq: Once | INTRAVENOUS | Status: AC
Start: 2017-11-22 — End: 2017-11-22
  Administered 2017-11-22: 50 meq via INTRAVENOUS

## 2017-11-22 MED ORDER — ASPIRIN 300 MG RE SUPP
300.0000 mg | Freq: Once | RECTAL | Status: AC
  Administered 2017-11-22: 300 mg via RECTAL
  Filled 2017-11-22: qty 1

## 2017-11-22 MED ORDER — FENTANYL 2500MCG IN NS 250ML (10MCG/ML) PREMIX INFUSION
100.0000 ug/h | INTRAVENOUS | Status: DC
  Administered 2017-11-22: 100 ug/h via INTRAVENOUS
  Filled 2017-11-22: qty 250

## 2017-11-22 MED ORDER — MIDAZOLAM BOLUS VIA INFUSION (WITHDRAWAL LIFE SUSTAINING TX)
5.0000 mg | INTRAVENOUS | Status: DC | PRN
  Filled 2017-11-22: qty 20

## 2017-11-22 MED ORDER — CISATRACURIUM BESYLATE (PF) 200 MG/20ML IV SOLN
1.0000 ug/kg/min | INTRAVENOUS | Status: DC
  Administered 2017-11-22: 1 ug/kg/min via INTRAVENOUS
  Filled 2017-11-22: qty 20

## 2017-11-22 MED ORDER — VANCOMYCIN HCL IN DEXTROSE 1-5 GM/200ML-% IV SOLN
1000.0000 mg | INTRAVENOUS | Status: DC
  Administered 2017-11-22: 1000 mg via INTRAVENOUS
  Filled 2017-11-22: qty 200

## 2017-11-22 MED ORDER — NOREPINEPHRINE 4 MG/250ML-% IV SOLN
0.0000 ug/min | INTRAVENOUS | Status: DC
  Filled 2017-11-22: qty 250

## 2017-11-22 MED ORDER — ORAL CARE MOUTH RINSE
15.0000 mL | OROMUCOSAL | Status: DC
  Administered 2017-11-22 (×4): 15 mL via OROMUCOSAL

## 2017-11-22 MED ORDER — SODIUM CHLORIDE 0.9 % IV SOLN
2.0000 mg/h | INTRAVENOUS | Status: DC
  Administered 2017-11-22: 2 mg/h via INTRAVENOUS
  Filled 2017-11-22: qty 10

## 2017-11-22 MED ORDER — DOPAMINE-DEXTROSE 3.2-5 MG/ML-% IV SOLN
0.0000 ug/kg/min | INTRAVENOUS | Status: DC
  Administered 2017-11-22: 5 ug/kg/min via INTRAVENOUS
  Filled 2017-11-22: qty 250

## 2017-11-22 MED ORDER — SODIUM CHLORIDE 0.9 % IV SOLN
0.0400 [IU]/min | INTRAVENOUS | Status: DC
  Administered 2017-11-22: 0.03 [IU]/min via INTRAVENOUS
  Filled 2017-11-22: qty 2

## 2017-11-22 MED ORDER — FENTANYL CITRATE (PF) 100 MCG/2ML IJ SOLN
100.0000 ug | Freq: Once | INTRAMUSCULAR | Status: AC
  Administered 2017-11-22: 100 ug via INTRAVENOUS
  Filled 2017-11-22: qty 2

## 2017-11-22 MED ORDER — FENTANYL BOLUS VIA INFUSION
50.0000 ug | INTRAVENOUS | Status: DC | PRN
  Filled 2017-11-22: qty 200

## 2017-11-22 MED ORDER — NICOTINE 14 MG/24HR TD PT24
14.0000 mg | MEDICATED_PATCH | Freq: Every day | TRANSDERMAL | Status: DC
  Administered 2017-11-22: 14 mg via TRANSDERMAL
  Filled 2017-11-22: qty 1

## 2017-11-22 MED ORDER — SODIUM CHLORIDE 0.9 % IV SOLN
INTRAVENOUS | Status: DC
  Administered 2017-11-22: 2.6 [IU]/h via INTRAVENOUS
  Filled 2017-11-22 (×2): qty 1

## 2017-11-22 MED ORDER — CISATRACURIUM BOLUS VIA INFUSION
0.1000 mg/kg | Freq: Once | INTRAVENOUS | Status: AC
  Administered 2017-11-22: 5 mg via INTRAVENOUS
  Filled 2017-11-22: qty 5

## 2017-11-22 MED ORDER — LACTATED RINGERS IV BOLUS
1000.0000 mL | Freq: Once | INTRAVENOUS | Status: AC
  Administered 2017-11-22: 1000 mL via INTRAVENOUS

## 2017-11-22 MED ORDER — LEVALBUTEROL HCL 0.63 MG/3ML IN NEBU: 0.6300 mg | INHALATION_SOLUTION | RESPIRATORY_TRACT | Status: DC | PRN

## 2017-11-22 MED ORDER — SODIUM CHLORIDE 0.9 % IV SOLN
1.0000 g | Freq: Once | INTRAVENOUS | Status: AC
  Administered 2017-11-22: 1 g via INTRAVENOUS
  Filled 2017-11-22: qty 10

## 2017-11-22 MED ORDER — SODIUM BICARBONATE 8.4 % IV SOLN
INTRAVENOUS | Status: AC
  Filled 2017-11-22: qty 50

## 2017-11-22 MED ORDER — SODIUM CHLORIDE 0.9 % IV SOLN: 1.0000 g | Freq: Once | INTRAVENOUS | Status: DC

## 2017-11-22 MED ORDER — FENTANYL BOLUS VIA INFUSION
50.0000 ug | INTRAVENOUS | Status: DC | PRN
  Filled 2017-11-22: qty 50

## 2017-11-22 MED ORDER — FUROSEMIDE 10 MG/ML IJ SOLN
40.0000 mg | Freq: Once | INTRAMUSCULAR | Status: AC
  Administered 2017-11-22: 40 mg via INTRAVENOUS
  Filled 2017-11-22: qty 4

## 2017-11-22 MED FILL — Medication: Qty: 1 | Status: AC

## 2017-11-24 LAB — CULTURE, RESPIRATORY

## 2017-11-24 LAB — CULTURE, RESPIRATORY W GRAM STAIN: Culture: NORMAL

## 2017-11-27 LAB — CULTURE, BLOOD (ROUTINE X 2)
CULTURE: NO GROWTH
CULTURE: NO GROWTH
SPECIAL REQUESTS: ADEQUATE
Special Requests: ADEQUATE

## 2017-12-06 ENCOUNTER — Telehealth: Payer: Self-pay | Admitting: Pulmonary Disease

## 2017-12-06 NOTE — Telephone Encounter (Signed)
Received D/C from [employer] for signing by Dr [facility]. Received D/C back and called funeral home to advise as well as faxed funeral home copy of D/C.

## 2017-12-07 ENCOUNTER — Telehealth: Payer: Self-pay | Admitting: Pulmonary Disease

## 2017-12-10 ENCOUNTER — Telehealth: Payer: Self-pay

## 2017-12-10 NOTE — Telephone Encounter (Signed)
On 12/10/17, a D/C was received from [facility] [employer]. The D/C is for cremation. The Patient is a patient of Dr. Tonia BroomsIcard. [facility] [place] [facility].   On 12/11/17 I received the D/C back from Dr. Tonia BroomsIcard. I got the signed D/C ready. I called the funeral home to let them know the D/C is ready and is being mailed to the [facility] per funeral home request.

## 2017-12-23 NOTE — Progress Notes (Signed)
CRITICAL VALUE ALERT  Critical Value:  Lactic acid 8.9  Date & Time Notied:  12/05/2017 1115  Provider Notified: Dr. Sherryll BurgerShah  Orders Received/Actions taken: None. Trending down.   Leanna BattlesEckelmann, Areanna Gengler Eileen, RN

## 2017-12-23 NOTE — Progress Notes (Signed)
Pt transported to CT scan then 2H via vent w/ no complications noted except upon entering 2H09 pt began to desat to 88-84% and increased pulm edema noted.  CCM at bedside and unit RT at bedside aware/report given.

## 2017-12-23 NOTE — ED Notes (Signed)
Ice packs placed on pt 

## 2017-12-23 NOTE — ED Provider Notes (Addendum)
.  Central Line Date/Time: 12/16/2017 8:08 AM Performed by: Melene PlanFloyd, Lakishia Bourassa, DO Authorized by: Melene PlanFloyd, Nhan Qualley, DO   Consent:    Consent obtained:  Emergent situation   Consent given by:  Patient Pre-procedure details:    Hand hygiene: Hand hygiene performed prior to insertion     Sterile barrier technique: All elements of maximal sterile technique followed     Skin preparation:  2% chlorhexidine and ChloraPrep Anesthesia (see MAR for exact dosages):    Anesthesia method:  Local infiltration   Local anesthetic:  Lidocaine 1% w/o epi Procedure details:    Location:  R internal jugular   Patient position:  Trendelenburg   Procedural supplies:  Triple lumen   Catheter size:  8.5 Fr   Landmarks identified: yes     Ultrasound guidance: yes     Sterile ultrasound techniques: Sterile gel and sterile probe covers were used     Number of attempts:  2   Successful placement: yes   Post-procedure details:    Post-procedure:  Dressing applied and line sutured   Assessment:  Blood return through all ports, free fluid flow, no pneumothorax on x-ray and placement verified by x-ray   Patient tolerance of procedure:  Tolerated well, no immediate complications  BLADDER CATHETERIZATION Date/Time: 12/07/2017 9:40 AM Performed by: Melene PlanFloyd, Tiera Mensinger, DO Authorized by: Melene PlanFloyd, Rut Betterton, DO   Consent:    Consent obtained:  Emergent situation   Alternatives discussed:  Alternative treatment Pre-procedure details:    Procedure purpose:  Therapeutic   Preparation: Patient was prepped and draped in usual sterile fashion   Anesthesia (see MAR for exact dosages):    Anesthesia method:  None Procedure details:    Provider performed due to:  Nurse unable to complete   Catheter insertion:  Indwelling   Catheter type:  Foley   Catheter size:  14 Fr   Bladder irrigation: no     Number of attempts:  1   Urine appearance: none. Post-procedure details:    Patient tolerance of procedure:  Tolerated well, no immediate  complications  Emergency Focused Ultrasound Exam Limited ultrasound of the Bladder.   Performed and interpreted by Dr. Adela LankFloyd Indication: evaluation for urinary retention Transverse and Sagittal views of the bladder are obtained and calculations are performed to determine an estimated bladder volume for signs of post-renal obstruction.  Findings: Bladder is not full Interpretation: no evidence of outlet obstruction Images archived electronically.  No bladder seen foley bulb seen where expected, patient likely dry  CPT Codes: [id number] and [id number]        Melene PlanFloyd, Cristal Qadir, DO 2018-01-10 [phone]

## 2017-12-23 NOTE — Progress Notes (Signed)
  Echocardiogram 2D Echocardiogram has been performed.  Julie ChimesWendy  [name] 12/19/2017, 2:37 PM

## 2017-12-23 NOTE — Progress Notes (Signed)
--   RPT abg continue to show decline with PH of 7.14 and PCO2 of 64 and PO2 of 46  -- She is on max support via ventilator with RR 30 and PEEP 12  -- will give another 2 amp of bicarb -- Spoke with other daughter at length and explained detoriorating patient condition, Family understood, All questions answered -- Family is leaning toward DNR just wish to have some more time to decide  Deborah Lazcano [place] [facility] Critical [facility]

## 2017-12-23 NOTE — Discharge Summary (Signed)
Physician Discharge Summary     Patient ID: Bryli Mantey MRN: [id number] DOB/AGE: 06-16-1965 52 y.o.  Admit date: 2017/12/10 Discharge date: 11/23/2017  Discharge Diagnoses:   Detailed Hospital Course:  52 year old female smoker with extensive past medical history including combined heart failure, severe ischemic cardiomyopathy with EF 20%, hypertension, CAD, previous MI, ?COPD who presented 8/1 with cardiac arrest.  On the morning of admission she called a friend with complaints of shortness of breath and chest pain.  EMS was called and on their arrival she had a nebulizer in her hand and was pulseless.  Approximately 5 minutes downtime prior to initiation of CPR.  Had 15 minutes of CPR by EMS prior to ROSC.  In ER was hypotensive, given 1 L normal saline and low-dose levo fed.  Remains hypoxic.  Chest x-ray with pulmonary edema and ? LLL infiltrate.  PCCM called for hypothermia protocol and ICU admission.  After continued attempts at resuscitation. She began to have significant bleeding. Hypothermia protocol was stopped. The family decided for palliative withdrawal of care. I placed withdrawal of care and comfort orders. The patient died peacefully with family at bedside.   Discharge Exam: I did not examine patient at time of death Death pronouncement was completed by nursing staff.   Labs at discharge Lab Results  Component Value Date   CREATININE 1.30 (H) 12-10-2017   BUN 16 12/10/17   NA 145 December 10, 2017   K 3.0 (L) 12/10/2017   CL 102 2017/12/10   CO2 11 (L) December 10, 2017   Lab Results  Component Value Date   WBC 29.7 (H) 12-10-17   HGB 10.2 (L) 12/10/2017   HCT 30.0 (L) 10-Dec-2017   MCV 98.2 2017-12-10   PLT 197 10-Dec-2017   Lab Results  Component Value Date   ALT 28 12-10-17   AST 56 (H) Dec 10, 2017   ALKPHOS 63 2017-12-10   BILITOT 0.3 12/10/17   Lab Results  Component Value Date   INR 2.51 10-Dec-2017   INR 2.06 2017-12-10   INR 0.95 08/28/2017    Current  radiology studies Ct Head Wo Contrast  Result Date: 12/10/2017 CLINICAL DATA:  Recent cardiac event with ROSC following 15 minutes of CPR EXAM: CT HEAD WITHOUT CONTRAST TECHNIQUE: Contiguous axial images were obtained from the base of the skull through the vertex without intravenous contrast. COMPARISON:  11/01/2001 FINDINGS: Brain: Mild atrophic changes and chronic white matter ischemic changes are seen. No findings to suggest acute hemorrhage, acute infarction or space-occupying mass lesion are seen. No diffuse cerebral edema is noted. Vascular: No hyperdense vessel or unexpected calcification. Skull: Somewhat limited by patient motion artifact although no gross skull abnormality is noted. Sinuses/Orbits: Within normal limits. Some mucosal changes are noted within the nasal passages likely related to the recent intubation Other: None IMPRESSION: Chronic atrophic and ischemic changes without acute abnormality. Electronically Signed   By: Alcide Clever M.D.   On: 12/10/2017 09:06   Dg Chest Port 1 View  Result Date: 2017-12-10 CLINICAL DATA:  s/p central line EXAM: PORTABLE CHEST 1 VIEW COMPARISON:  Is Dec 10, 2017 FINDINGS: Patient has a RIGHT internal jugular central line, tip overlying the level of superior vena cava. Endotracheal tube is in place with tip 4.2 centimeters above the carina. Nasogastric tube is in place with tip beyond the gastroesophageal junction off the image. The heart is enlarged. There are diffuse bilateral interstitial markings consistent with mild edema. More confluent opacity has developed at the LEFT lung base raising the question of overt alveolar edema,  atelectasis, or new consolidation. There is no pneumothorax. IMPRESSION: 1. Interval placement of RIGHT internal jugular central line. No pneumothorax. 2. Interstitial pulmonary edema. 3. Developing opacity at the LEFT lung base, consistent with edema, atelectasis, or new infiltrate. Electronically Signed   By: Norva PavlovElizabeth  Brown M.D.    On: 01-29-2018 08:44   Dg Chest Portable 1 View  Result Date: 12/16/2017 CLINICAL DATA:  Intubation. EXAM: PORTABLE CHEST 1 VIEW COMPARISON:  Radiographs and CT 08/28/2017 FINDINGS: Endotracheal tube 3.4 cm above the carina. Enteric tube in place with tip and side-port below the diaphragm. The heart is enlarged. Patchy bilateral suprahilar opacities, right greater than left. Kerley lines consistent with pulmonary edema. No pneumothorax. No evidence of pleural effusion. IMPRESSION: 1. Endotracheal tube tip 3.4 cm from the carina. Tip and side port of the enteric tube below the diaphragm in the stomach. 2. Cardiomegaly. There is pulmonary edema with Kerley B-lines. Patchy bilateral suprahilar opacities may represent alveolar edema, pneumonia or aspiration. Electronically Signed   By: Rubye OaksMelanie  Ehinger M.D.   On: 01-29-2018 06:59    Discharged Condition & Disposition: Deceased    Josephine IgoBradley L Orma Cheetham, DO  Pulmonary Critical Care 11/23/2017 3:44 PM  Pager: [phone]

## 2017-12-23 NOTE — Progress Notes (Signed)
ANTICOAGULATION CONSULT NOTE - Initial Consult  Pharmacy Consult for Heparin Management Indication: chest pain/ACS  Allergies  Allergen Reactions  . Codeine Rash  . Penicillins Rash    Has patient had a PCN reaction causing immediate rash, facial/tongue/throat swelling, SOB or lightheadedness with hypotension: No Has patient had a PCN reaction causing severe rash involving mucus membranes or skin necrosis: No Has patient had a PCN reaction that required hospitalization: No Has patient had a PCN reaction occurring within the last 10 years: No If all of the above answers are "NO", then may proceed with Cephalosporin use.    Patient Measurements: Height: '4\' 10"'$  (147.3 cm) Weight: 109 lb 5.6 oz (49.6 kg)(from July 2019 OV records) IBW/kg (Calculated) : 40.9 HEPARIN DW (KG): 49.6   Vital Signs: Temp: 79 F (26.1 C) (08/01 0641) BP: 124/113 (08/01 0844) Pulse Rate: 116 (08/01 0844)  Labs: Recent Labs    2017-12-02 0640 12-02-2017 0647  HGB 10.0* 9.5*  HCT 35.9* 28.0*  PLT 155  --   LABPROT 23.1*  --   INR 2.06  --   CREATININE 1.24* 1.00  TROPONINI 0.10*  --     Estimated Creatinine Clearance: 47.2 mL/min (by C-G formula based on SCr of 1 mg/dL).   Medical History: Past Medical History:  Diagnosis Date  . Acute ST elevation myocardial infarction (STEMI) involving LAD: 02/11/2017   Likely delayed presentation - 100% mLAD - s/p PTCA 20% (slow reflow); Also noted Severe 80% Ost LM, 75% mCx & 80% OM2 as well as ~60% non-dom RCA. Severely Elevated LVEDP  . Bicipital tenosynovitis   . Calcifying tendinitis of shoulder   . Cervical spondylosis without myelopathy   . Chronic combined systolic (congestive) and diastolic (congestive) heart failure ([place]) 08/28/2017  . Essential hypertension   . Hyperlipidemia with target LDL less than 70 02/12/2017  . Intracranial injury of other and unspecified nature, without mention of open intracranial wound, unspecified state of consciousness    . Left main coronary artery disease: 80% stenosis confirmed by IVUS; 02/11/2017   Ost LM lesion, 80 %stenosed. 2nd Mrg lesion, 85 %stenosed. Mid Cx to Dist Cx lesion, 75 %stenosed. Prox LAD lesion, 100 %stenosed. -- PTCA: Post intervention, there is a 20% residual stenosis. Prox RCA to Mid RCA lesion, 60 %stenosed. (Non-dominant) LV end diastolic pressure is severely elevated.  . Personality change due to conditions classified elsewhere   . Tobacco use 02/12/2017    Medications:  Scheduled:  . artificial tears  1 application Both Eyes O1H  . aspirin  300 mg Rectal Once  . chlorhexidine gluconate (MEDLINE KIT)  15 mL Mouth Rinse BID  . Chlorhexidine Gluconate Cloth  6 each Topical Daily  . cisatracurium  0.1 mg/kg Intravenous Once  . insulin aspart  2-6 Units Subcutaneous Q4H  . mouth rinse  15 mL Mouth Rinse 10 times per day  . nicotine  14 mg Transdermal Daily  . sodium chloride flush  10-40 mL Intracatheter Q12H   Infusions:  . sodium chloride    . calcium chloride    . ceFEPime (MAXIPIME) IV    . cisatracurium (NIMBEX) infusion    . famotidine (PEPCID) IV    . fentaNYL infusion INTRAVENOUS 100 mcg/hr (December 02, 2017 0953)  . lactated ringers 1,000 mL (2017/12/02 0903)  . midazolam (VERSED) infusion    . norepinephrine (LEVOPHED) Adult infusion    .  sodium bicarbonate  infusion 1000 mL 75 mL/hr at 12/02/17 0856  . vancomycin  Assessment: Pt is a 31 yoF with a history of severe ischemic cardiomyopathy and previous MI (01/2017). Pt started on heparin s/p out of hospital cardiac arrest preceded by complaints of chest pain. H/H low, platelets WNL. Blood noted in OG tube prior to initiation of heparin, no other s/sx of bleeding.  Goal of Therapy:  Heparin level 0.3-0.7 units/ml Monitor platelets by anticoagulation protocol: Yes   Plan:  Give 3000 units bolus x 1 Start heparin infusion at 550 units/hr Check anti-Xa level in 6 hours and daily while on heparin Continue to monitor  H&H, platelets, and blood from OG tube   Claiborne Billings, PharmD PGY2 Cardiology Pharmacy Resident Phone [phone] 12-12-2017 10:08 AM

## 2017-12-23 NOTE — Progress Notes (Signed)
CRITICAL VALUE ALERT  Critical Value:  PTT >200  Date & Time Notied:  12/06/2017 1430  Provider Notified: Dr. Sherryll BurgerShah  Orders Received/Actions taken: MD aware. Will consider given IV protamine.   Leanna BattlesEckelmann, Amiree No Eileen, RN

## 2017-12-23 NOTE — Progress Notes (Signed)
Responded to page from unit secretary to provide support to pt daughter at bedside. Pt is actively declining and family is planning to make decision on how to continue care. Daughter and niece at bedside and waiting on sister to arrive. Daughter very tearful and having hard time coping.  Provided empathetic listening, ministry of presence, guidance, emotional and grief support. Chaplain availiabe as needed.   03/30/18 1407  Clinical Encounter Type  Visited With Patient and family together;Health care provider  Visit Type Initial;Spiritual support;Critical Care;Patient actively dying  Referral From Nurse  Spiritual Encounters  Spiritual Needs Prayer;Emotional;Grief support  Stress Factors  Family Stress Factors Exhausted;Major life changes  Fae PippinWatlington, Tassie Pollett, Chaplain, [facility], Pager [phone]

## 2017-12-23 NOTE — Procedures (Signed)
ELECTROENCEPHALOGRAM REPORT   Patient: Julie Simpson       Room #: [id number] EEG No. ID: [id number] Age: 52 y.o.        Sex: female Referring Physician: Icard Report Date:  12/13/2017        Interpreting Physician: Thana FarrEYNOLDS, Synda Bagent  History: Julie Simpson is an 52 y.o. female s/p arrest  Medications:  ASA, Maxipime, Pepcid, Vancomycin, Nimbex, Fentanyl, Heparin, Insulin, Versed, Levophed, Pitressin  Conditions of Recording:  This is a 21 channel routine scalp EEG performed with bipolar and monopolar montages arranged in accordance to the international 10/20 system of electrode placement. One channel was dedicated to EKG recording.  The patient is in the intubated and sedated state.  Description:  Conditions of Recording: This is a prolonged 16 channel EEG carried out with concomitant video monitoring from 06/22/2013 at 0730 to 06/22/2013 at 0348. The patient is in the intubated and sedated state.   Description:  The background activity is discontinuous. It consists of bursts of polyspike, spike and slow wave activity alternating with periods of attenuation. The burst activity lasts up to 2 seconds.  The periods of attenuation last up to 18 seconds. This discontinuous activity is maintained throughout the tracing.  No activation procedures were performed Hyperventilation and intermittent photic stimulation were not performed.   IMPRESSION: This is an abnormal electroencephalogram secondary to burst-suppression activity.  Although this may be seen in the setting of diffuse brain injury can no rule out a medication effect.  Clinical correlation recommended.     Thana FarrLeslie Shaletta Hinostroza, MD Neurology [phone] 12/11/2017, 12:26 PM

## 2017-12-23 NOTE — ED Notes (Signed)
[employer] pads placed on pt ,

## 2017-12-23 NOTE — H&P (Addendum)
PULMONARY / CRITICAL CARE MEDICINE   Name: Julie Simpson MRN: [id number] DOB: [birth year]    ADMISSION DATE:  Dec 02, 2017 CONSULTATION DATE:  8/1  REFERRING MD:  EDP   CHIEF COMPLAINT:  Post arrest   HISTORY OF PRESENT ILLNESS:   52 year old female smoker with extensive past medical history including combined heart failure, severe ischemic cardiomyopathy with EF 20%, hypertension, CAD, previous MI, ?COPD who presented 8/1 with cardiac arrest.  On the morning of admission she called a friend with complaints of shortness of breath and chest pain.  EMS was called and on their arrival she had a nebulizer in her hand and was pulseless.  Approximately 5 minutes downtime prior to initiation of CPR.  Had 15 minutes of CPR by EMS prior to ROSC.  In ER was hypotensive, given 1 L normal saline and low-dose levo fed.  Remains hypoxic.  Chest x-ray with pulmonary edema and ? LLL infiltrate.  PCCM called for hypothermia protocol and ICU admission.  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  PAST SURGICAL HISTORY: She  has a past surgical history that includes Hernia repair; Colon surgery; Tubal ligation; LEFT HEART CATH AND CORONARY ANGIOGRAPHY (N/A, 02/11/2017); Intravascular Ultrasound/IVUS (N/A, 02/11/2017); and CORONARY BALLOON ANGIOPLASTY (N/A,  02/11/2017).  Allergies  Allergen Reactions  . Codeine Rash  . Penicillins Rash    Has patient had a PCN reaction causing immediate rash, facial/tongue/throat swelling, SOB or lightheadedness with hypotension: No Has patient had a PCN reaction causing severe rash involving mucus membranes or skin necrosis: No Has patient had a PCN reaction that required hospitalization: No Has patient had a PCN reaction occurring within the last 10 years: No If all of the above answers are "NO", then may proceed with Cephalosporin use.    No current facility-administered medications on file prior to encounter.    Current Outpatient Medications on File Prior to Encounter  Medication Sig  . albuterol (PROVENTIL) (2.5 MG/3ML) 0.083% nebulizer solution Take 3 mLs (2.5 mg total) by nebulization every 4 (four) hours as needed for wheezing or shortness of breath. (Patient not taking: Reported on 08/28/2017)  . ALPRAZolam (XANAX) 0.25 MG tablet Take 1 tablet (0.25 mg total) by mouth 3 (three) times daily as needed for anxiety.  Marland Kitchen aspirin EC 81 MG EC tablet Take 1 tablet (81 mg total) by mouth daily.  Marland Kitchen atorvastatin (LIPITOR) 80 MG tablet Take 1 tablet (80 mg total) by mouth daily at 6 PM. (Patient not taking: Reported on 08/28/2017)  . baclofen (LIORESAL) 10 MG tablet Take 1 tablet (10 mg total) by mouth 3 (three) times daily as needed for muscle spasms. (Patient not taking: Reported on 08/28/2017)  . carvedilol (COREG) 3.125 MG tablet Take 1 tablet (3.125 mg total) by mouth 2 (two) times daily with a meal.  . clopidogrel (PLAVIX) 75 MG tablet Take 1 tablet (75 mg total) by mouth daily.  . digoxin (LANOXIN) 0.125 MG tablet Take 1 tablet (0.125 mg total) by mouth daily.  Marland Kitchen diltiazem (CARDIZEM CD)  120 MG 24 hr capsule Take 120 mg by mouth every morning.  . furosemide (LASIX) 20 MG tablet Take 40 mg by mouth daily.  . isosorbide mononitrate (IMDUR) 60 MG 24 hr tablet Take 1 tablet (60 mg total) by mouth daily.  Marland Kitchen  lisinopril (PRINIVIL,ZESTRIL) 10 MG tablet Take 10 mg by mouth daily.  Marland Kitchen lisinopril (PRINIVIL,ZESTRIL) 2.5 MG tablet Take 1 tablet (2.5 mg total) by mouth daily. (Patient not taking: Reported on 08/28/2017)  . nitroGLYCERIN (NITROSTAT) 0.3 MG SL tablet Place 0.3 mg under the tongue See admin instructions. Place 1 tablet under the tongue every 5 (five) minutes as needed for up to 14 days for chest pain.  . pantoprazole (PROTONIX) 40 MG tablet Take 1 tablet (40 mg total) by mouth as needed (for GERD). (Patient not taking: Reported on 08/28/2017)  . sertraline (ZOLOFT) 50 MG tablet Take 1 tablet (50 mg total) by mouth daily.  Marland Kitchen spironolactone (ALDACTONE) 25 MG tablet Take 0.5 tablets (12.5 mg total) by mouth daily.  . traZODone (DESYREL) 100 MG tablet TAKE ONE TABLET AT BEDTIME (Patient not taking: Reported on 08/28/2017)    FAMILY HISTORY:  Her family history includes Cancer (age of onset: 60) in her mother; Diabetes in her father.  SOCIAL HISTORY: She  reports that she quit smoking about 3 months ago. She has a 30.00 pack-year smoking history. She has never used smokeless tobacco. She reports that she drank alcohol. She reports that she has current or past drug history.  REVIEW OF SYSTEMS:   Unable.   SUBJECTIVE:    VITAL SIGNS: BP (!) 124/113   Pulse (!) 116   Temp (!) 79 F (26.1 C)   Resp (!) 40   Ht 4\' 10"  (1.473 m)   Wt 49.6 kg (109 lb 5.6 oz) Comment: from July 2019 OV records  SpO2 (!) 89%   BMI 22.85 kg/m   HEMODYNAMICS:    VENTILATOR SETTINGS: Vent Mode: PRVC FiO2 (%):  [100 %] 100 % Set Rate:  [18 bmp-30 bmp] 30 bmp Vt Set:  [330 mL-340 mL] 330 mL PEEP:  [5 cmH20-8 cmH20] 8 cmH20 Plateau Pressure:  [17 cmH20-24 cmH20] 17 cmH20  INTAKE / OUTPUT: No intake/output data recorded.  PHYSICAL EXAMINATION: General:  chronically ill appearing female, appears much older than stated age Neuro: Unresponsive postarrest, positive gag, pupils 3 mm and sluggish but  reactive HEENT: MM moist, ETT Cardiovascular: S1-S2 RRR Lungs: Tachypneic on vent, crackles throughout, few expiratory wheezes Abdomen: Soft, negative bowel sounds Musculoskeletal: Cool, mottled  LABS:  BMET Recent Labs  Lab December 18, 2017 0640 Dec 18, 2017 0647  NA 146* 144  K 4.5 4.5  CL 116* 111  CO2 11*  --   BUN 11 11  CREATININE 1.24* 1.00  GLUCOSE 302* 280*    Electrolytes Recent Labs  Lab Dec 18, 2017 0640  CALCIUM 7.3*    CBC Recent Labs  Lab 2017/12/18 0640 2017-12-18 0647  WBC 12.7*  --   HGB 10.0* 9.5*  HCT 35.9* 28.0*  PLT 155  --     Coag's Recent Labs  Lab December 18, 2017 0640  INR 2.06    Sepsis Markers Recent Labs  Lab Dec 18, 2017 0647  LATICACIDVEN 14.85*    ABG Recent Labs  Lab 12-18-17 0725  PHART 6.832*  PCO2ART 70.4*  PO2ART 137.0*    Liver Enzymes Recent Labs  Lab 2017-12-18 0640  AST 56*  ALT 28  ALKPHOS 63  BILITOT 0.3  ALBUMIN 2.0*    Cardiac Enzymes Recent Labs  Lab 12/18/2017 0640  TROPONINI 0.10*    Glucose No results for input(s): GLUCAP in the last 168 hours.  Imaging Ct Head Wo Contrast  Result Date: 12/18/17 CLINICAL DATA:  Recent cardiac event with ROSC following 15 minutes of CPR EXAM: CT HEAD WITHOUT CONTRAST TECHNIQUE: Contiguous axial images were obtained from the base of the skull through the vertex without intravenous contrast. COMPARISON:  11/01/2001 FINDINGS: Brain: Mild atrophic changes and chronic white matter ischemic changes are seen. No findings to suggest acute hemorrhage, acute infarction or space-occupying mass lesion are seen. No diffuse cerebral edema is noted. Vascular: No hyperdense vessel or unexpected calcification. Skull: Somewhat limited by patient motion artifact although no gross skull abnormality is noted. Sinuses/Orbits: Within normal limits. Some mucosal changes are noted within the nasal passages likely related to the recent intubation Other: None IMPRESSION: Chronic atrophic and ischemic changes  without acute abnormality. Electronically Signed   By: Alcide Clever M.D.   On: 12-18-17 09:06   Dg Chest Port 1 View  Result Date: 12-18-17 CLINICAL DATA:  s/p central line EXAM: PORTABLE CHEST 1 VIEW COMPARISON:  Is 12-18-17 FINDINGS: Patient has a RIGHT internal jugular central line, tip overlying the level of superior vena cava. Endotracheal tube is in place with tip 4.2 centimeters above the carina. Nasogastric tube is in place with tip beyond the gastroesophageal junction off the image. The heart is enlarged. There are diffuse bilateral interstitial markings consistent with mild edema. More confluent opacity has developed at the LEFT lung base raising the question of overt alveolar edema, atelectasis, or new consolidation. There is no pneumothorax. IMPRESSION: 1. Interval placement of RIGHT internal jugular central line. No pneumothorax. 2. Interstitial pulmonary edema. 3. Developing opacity at the LEFT lung base, consistent with edema, atelectasis, or new infiltrate. Electronically Signed   By: Norva Pavlov M.D.   On: 12-18-2017 08:44   Dg Chest Portable 1 View  Result Date: 12/18/17 CLINICAL DATA:  Intubation. EXAM: PORTABLE CHEST 1 VIEW COMPARISON:  Radiographs and CT 08/28/2017 FINDINGS: Endotracheal tube 3.4 cm above the carina. Enteric tube in place with tip and side-port below the diaphragm. The heart is enlarged. Patchy bilateral suprahilar opacities, right greater than left. Kerley lines consistent with pulmonary edema. No pneumothorax. No evidence of pleural effusion. IMPRESSION: 1. Endotracheal tube tip 3.4 cm from the carina. Tip and side port of the enteric tube below the diaphragm in the stomach. 2. Cardiomegaly. There is pulmonary edema with Kerley B-lines. Patchy bilateral suprahilar opacities may represent alveolar edema, pneumonia or aspiration. Electronically Signed   By: Rubye Oaks M.D.   On: 12-18-17 06:59     STUDIES:  CT head 8/1 >>neg acute    CULTURES: Blood cultures x2 8/1 >> Urine 8/1>>>  Sputum 8/1>>>  ANTIBIOTICS: Vanc 8/1>>> Cefepime 8/1>>>  SIGNIFICANT EVENTS:   LINES/TUBES: ETT 8/1>>> R IJ CVL 8/1>>>  DISCUSSION: Chronically ill 52 year old female with history of combined diastolic/systolic heart failure and severe ischemic cardiomyopathy with known EF of 20%, severe CAD admitted 8/1 with out of hospital arrest.  Total 15-20 minutes of downtime including approximately 5 minutes downtime prior to initiation of CPR.  ASSESSMENT / PLAN:  PULMONARY Acute hypoxic and hypercarbic respiratory failure ? COPD Left lower lobe infiltrate-?  HCAP (recent hospitalization) v aspiration pneumonia Respiratory acidosis P:   Vent support - 8cc/kg  F/u CXR  F/u ABG PRN bronchodilators Antibiotics as above Sputum culture Increased respiratory rate to 30  CARDIOVASCULAR Out-of-hospital cardiac arrest History severe CAD- known  80% left main stenosis status post prior PTCA with probable chronic occluded LAD with non-viability in the LAD and moderate stenosis of the RCA.  Previously deemed nonsurgical candidate  Pulmonary edema Severe ischemic cardiomyopathy - recent EF 17% on MRI  history of hypertension Combined heart failure Shock  p:  Cardiology following Hypothermia protocol No plans for urgent cath - Previously deemed nonsurgical candidate Heparin drip for ACS if head CT ok Lasix 40 mg IV x1 now KVO IV fluids Norepi as needed for blood pressure support Check dig level  aspirin Hold home Coreg, Plavix, digoxin, diltiazem, Lasix, Imdur, lisinopril, Aldactone CVP Trend troponin 2D echo  RENAL AKI Hypernatremia Metabolic acidosis P:   Hold further volume Follow-up chemistry every 2 hours Trend lactate HCO3 gtt @75   GASTROINTESTINAL No active issue P:   N.p.o. Pepcid  HEMATOLOGIC Anemia-mild.  No S/S bleeding P:  Follow-up CBC Heparin drip per pharmacy  INFECTIOUS ?HCAP v aspiration  pneumonia- suspect primary cardiac arrest but did have shortness of breath as initial symptom and has LLL infiltrate. P:   Empiric antibiotics as above Trend procalcitonin Trend WBC, fever curve  ENDOCRINE hyperglycemia P:   ICU hyperglycemia protocol-may need insulin drip  NEUROLOGIC Altered mental status-post cardiac arrest P:   RASS goal: -4/-5 Hypothermia protocol EEG Fentanyl, Versed, Nimbex   FAMILY  - Updates:  No family at bedside 8/1 on [facility].  Updated in ER per RN.  Will re-round in a bit and attempt to update them further.   - Inter-disciplinary family meet or Palliative Care meeting due by:  Day 7    Dirk DressKaty Whiteheart, NP 11/30/2017  9:22 AM Pager: [phone] or [phone]) [phone]

## 2017-12-23 NOTE — Progress Notes (Signed)
TTM protocol discontinued at this time per Dr. Sherryll BurgerShah. Patient is severely hypotensive and not tolerating it well. She had reached 33 degrees at 1000.  Leanna BattlesEckelmann, Deloris Mittag Eileen, RN

## 2017-12-23 NOTE — Progress Notes (Signed)
Spoke with both daughter at length again. Understand patients declining condition and do not wish any more CPR  Patient will be DNR with continue other aggressive measures  [facility]

## 2017-12-23 NOTE — Progress Notes (Signed)
ABG drawn by Dr. Sherryll BurgerShah in femoral artery due to unable to obtain in  Right Radial x1 or palpate pulse brachialx2 or left radial. Critical results given to MD at bedside.

## 2017-12-23 NOTE — Progress Notes (Signed)
CRITICAL VALUE ALERT  Critical Value:  Lactic acid 5.1  Date & Time Notied:  11/29/2017 1422  Provider Notified: Dr. Sherryll BurgerShah  Orders Received/Actions taken: None. Trending down.   Leanna BattlesEckelmann, Mitsuye Schrodt Eileen, RN

## 2017-12-23 NOTE — Progress Notes (Addendum)
Chaplain responded to STEMI. Patient is not verbal.  EMS stated that a roommate was in the home, but it was not clear if this person was coming to the hospital.  Chaplain attempted to reach family members; spoke with patient sister-in-law, Lawson Radarngela [facility], who is listed as a third emergency contact.  Angela's home number is incorrect. Best phone: [phone] (cell).  Marylene Landngela stated that patient had indicated she wanted her and another friend to be HCPOAs. Patient intended to complete paperwork, but it has not been done yet.    Family dynamics are complex and conflicted.  *PLEASE NOTE: Emergency contact information is mostly out of date. Patient is now divorced; per sister-in-law: DO NOT CONTACT Shane Doyel, listed as "husband" but now divorced from patient - they went through a bad breakup.    Patient's brother Jaquelyn Bitter(Alex [facility]) is Angela's husband, but brother does not want contact with patient.  Patient daughter Gearldine Bienenstock(Brandy) has bipolar disease, has been in and out of jail.  Marylene Landngela thinks that daughter may be living with patient (may have been the person EMS assumed to be "roommate"). Daughter does not have transportation. Patient's relationship with daughter is "on and off".  Chaplain was unable to reach daughter 2X (voicemail is full). Patient has a second daughter, with whom she has no contact (but Marylene Landngela remains in contact with this second daughter).    Sister-in-law will come to hospital after 8 AM.    Please call as needed for support.   Theodoro ParmaKristina N Adisa Litt, Chaplain [phone]     May 23, 2017 0700  Clinical Encounter Type  Visited With Patient not available  Visit Type Critical Care  Referral From Nurse  Consult/Referral To Chaplain  Stress Factors  Patient Stress Factors Health changes

## 2017-12-23 NOTE — Progress Notes (Signed)
Pharmacy Antibiotic Note  Julie Simpson is a 52 y.o. female admitted on 12/19/2017 with left lower lobe infiltrates, suspicious for  pneumonia.  Pharmacy has been consulted for vancomycin and cefepime dosing.  Plan: Vancomycin 1000 mg IV every 24 hours.  Goal trough 15-20 mcg/mL.  Cefepime 1000 mg IV q24h  Height: 4\' 10"  (147.3 cm) Weight: 109 lb 5.6 oz (49.6 kg)(from July 2019 OV records) IBW/kg (Calculated) : 40.9  Temp (24hrs), Avg:79 F (26.1 C), Min:79 F (26.1 C), Max:79 F (26.1 C)  Recent Labs  Lab 08/13/2017 0640 08/13/2017 0647  WBC 12.7*  --   CREATININE 1.24* 1.00  LATICACIDVEN  --  14.85*    Estimated Creatinine Clearance: 47.2 mL/min (by C-G formula based on SCr of 1 mg/dL).    Allergies  Allergen Reactions  . Codeine Rash  . Penicillins Rash    Has patient had a PCN reaction causing immediate rash, facial/tongue/throat swelling, SOB or lightheadedness with hypotension: No Has patient had a PCN reaction causing severe rash involving mucus membranes or skin necrosis: No Has patient had a PCN reaction that required hospitalization: No Has patient had a PCN reaction occurring within the last 10 years: No If all of the above answers are "NO", then may proceed with Cephalosporin use.    Antimicrobials this admission: Cefepime 8/1 >>  Vancomycin 8/1 >>   Microbiology results: 8/1 BCx: pending  Thank you for allowing pharmacy to be a part of this patient's care.  Marcelino FreestoneEmily Odette Watanabe, PharmD PGY2 Cardiology Pharmacy Resident Phone [phone]) [phone] 12/09/2017 9:57 AM

## 2017-12-23 NOTE — Progress Notes (Signed)
EEG complete - results pending 

## 2017-12-23 NOTE — ED Notes (Signed)
ED MD at bedside placing Central line ,

## 2017-12-23 NOTE — Progress Notes (Signed)
PCCM:  Spoke with patients family at bedside. Both daughters present as well as brother. She is unmarried per the daughters and they are her medical decision makers.   They have collectively decided to pursue palliative withdrawal.   Comfort care orders were placed in Epic.   Josephine IgoBradley L Icard, DO [facility] 11/27/2017

## 2017-12-23 NOTE — Progress Notes (Signed)
Per ED MD s/p ABG results, maintain current vent settings.

## 2017-12-23 NOTE — Progress Notes (Signed)
Dr. Sherryll BurgerShah notified of critical ABG results from istat.   Leanna BattlesEckelmann, Shanel Prazak Eileen, RN

## 2017-12-23 NOTE — Procedures (Signed)
Extubation Procedure Note  Patient Details:   Name: Julie Simpson DOB: 1965/06/11 MRN: [id number]   Airway Documentation:    Vent end date: 2017-11-17 Vent end time: 1745   Evaluation  O2 sats: currently acceptable Complications: No apparent complications Patient did tolerate procedure well. Bilateral Breath Sounds: Rhonchi, Coarse crackles   Pt extubated per withdrawal order. Pt on room air and family and RN at bedside.  Julie Simpson, Julie Simpson [facility] 12/18/2017, 5:52 PM
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
## 2017-12-23 NOTE — ED Provider Notes (Signed)
[facility] Provider Note   CSN: [id number] Arrival date & time: 12/20/2017  [id number]     History   Chief Complaint Chief Complaint  Patient presents with  . Cardiac Arrest    HPI Julie Simpson is a 52 y.o. female.  HPI  Level 5 caveat for postarrest patient.  52 year old female comes in with chief complaint of cardiac arrest. Patient has history of coronary artery disease, hypertension, questionable COPD.  According to EMS, patient was on the phone with a friend when she started having shortness of breath.  Patient took a nitro and nebulizer treatment when she arrested.  There was about 5 minutes of downtime when EMS arrived, CPR was started by bystander.  EMS continued CPR for another 10 minutes and had ROSC after 2 rounds of epi.  Postarrest there was some nonspecific EKG changes, therefore code STEMI was activated on the field. Patient has known CAD and has refused CABG in the recent past.  Past Medical History:  Diagnosis Date  . Acute ST elevation myocardial infarction (STEMI) involving LAD: 02/11/2017   Likely delayed presentation - 100% mLAD - s/p PTCA 20% (slow reflow); Also noted Severe 80% Ost LM, 75% mCx & 80% OM2 as well as ~60% non-dom RCA. Severely Elevated LVEDP  . Bicipital tenosynovitis   . Calcifying tendinitis of shoulder   . Cervical spondylosis without myelopathy   . Chronic combined systolic (congestive) and diastolic (congestive) heart failure (HCC) 08/28/2017  . Essential hypertension   . Hyperlipidemia with target LDL less than 70 02/12/2017  . 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 Left main coronary artery disease: 80% stenosis confirmed by IVUS; 02/11/2017   Ost LM lesion, 80 %stenosed. 2nd Mrg lesion, 85 %stenosed. Mid Cx to Dist Cx lesion, 75 %stenosed. Prox LAD lesion, 100 %stenosed. -- PTCA: Post intervention, there  is a 20% residual stenosis. Prox RCA to Mid RCA lesion, 60 %stenosed. (Non-dominant) LV end diastolic pressure is severely elevated.  . Personality change due to conditions classified elsewhere   . Tobacco use 02/12/2017    Patient Active Problem List   Diagnosis Date Noted  . Cardiac arrest (HCC) 10-15-2017  . Chronic combined systolic (congestive) and diastolic (congestive) heart failure (HCC) 08/28/2017  . Unstable angina (HCC) 08/28/2017  . Hyperlipidemia with target LDL less than 70 02/12/2017  . Tobacco use 02/12/2017  . Acute combined systolic and diastolic heart failure (HCC) 02/12/2017  . Essential hypertension   . Acute ST elevation myocardial infarction (STEMI) involving LAD: 02/11/2017    Class: Hospitalized for  . Left main coronary artery disease: 80% stenosis confirmed by IVUS; 02/11/2017  . Protein-calorie malnutrition, severe (HCC) 12/23/2013  . Goiter 09/24/2012  . Depression 05/28/2012  . Knee osteoarthritis 11/01/2011  . Patellar tendinitis (bilateral) 08/02/2011  . Greater trochanteric bursitis right 08/02/2011  . Traumatic brain injury (HCC) - h/o 08/02/2011  . Chronic pain 08/02/2011    Past Surgical History:  Procedure Laterality Date  . COLON SURGERY     polypectomy  . CORONARY BALLOON ANGIOPLASTY N/A 02/11/2017   Procedure: CORONARY BALLOON ANGIOPLASTY;  Surgeon: SwazilandJordan, Peter M, MD;  [facility];  Service: Cardiovascular;  Laterality: N/A;  . HERNIA REPAIR    . INTRAVASCULAR ULTRASOUND/IVUS N/A 02/11/2017   Procedure: Intravascular Ultrasound/IVUS;  Surgeon: [place]il[place]Jordan, Peter M, MD;  [facility];  Service: Cardiovascular;  Laterality: N/A;  .  LEFT HEART CATH AND CORONARY ANGIOGRAPHY N/A 02/11/2017   Procedure: LEFT HEART CATH AND CORONARY ANGIOGRAPHY;  Surgeon: [place]il[place], Peter M, MD;  Location: [facility] INVASIVE CV LAB;  Service: Cardiovascular;  Laterality: N/A;  . TUBAL LIGATION       OB History   None      Home  Medications    Prior to Admission medications   Medication Sig Start Date End Date Taking? Authorizing Provider  albuterol (PROVENTIL) (2.5 MG/3ML) 0.083% nebulizer solution Take 3 mLs (2.5 mg total) by nebulization every 4 (four) hours as needed for wheezing or shortness of breath. Patient not taking: Reported on 08/28/2017 02/17/17   Manson Passey, PA  ALPRAZolam (XANAX) 0.25 MG tablet Take 1 tablet (0.25 mg total) by mouth 3 (three) times daily as needed for anxiety. 08/29/17   Kroeger, Ovidio Kin., PA-C  aspirin EC 81 MG EC tablet Take 1 tablet (81 mg total) by mouth daily. 02/18/17   Manson Passey, PA  atorvastatin (LIPITOR) 80 MG tablet Take 1 tablet (80 mg total) by mouth daily at 6 PM. Patient not taking: Reported on 08/28/2017 02/17/17   Manson Passey, PA  baclofen (LIORESAL) 10 MG tablet Take 1 tablet (10 mg total) by mouth 3 (three) times daily as needed for muscle spasms. Patient not taking: Reported on 08/28/2017 01/11/15   Ranelle Oyster, MD  carvedilol (COREG) 3.125 MG tablet Take 1 tablet (3.125 mg total) by mouth 2 (two) times daily with a meal. 02/17/17   Bhagat, Bhavinkumar, PA  clopidogrel (PLAVIX) 75 MG tablet Take 1 tablet (75 mg total) by mouth daily. 02/18/17   Bhagat, Sharrell Ku, PA  digoxin (LANOXIN) 0.125 MG tablet Take 1 tablet (0.125 mg total) by mouth daily. 02/18/17   Bhagat, Sharrell Ku, PA  diltiazem (CARDIZEM CD) 120 MG 24 hr capsule Take 120 mg by mouth every morning. 06/23/17   [provider]  furosemide (LASIX) 20 MG tablet Take 40 mg by mouth daily. 07/25/17   [provider]  isosorbide mononitrate (IMDUR) 60 MG 24 hr tablet Take 1 tablet (60 mg total) by mouth daily. 02/18/17   Bhagat, Sharrell Ku, PA  lisinopril (PRINIVIL,ZESTRIL) 10 MG tablet Take 10 mg by mouth daily.    [provider]  lisinopril (PRINIVIL,ZESTRIL) 2.5 MG tablet Take 1 tablet (2.5 mg total) by mouth daily. Patient not taking: Reported on 08/28/2017  02/17/17   Manson Passey, PA  nitroGLYCERIN (NITROSTAT) 0.3 MG SL tablet Place 0.3 mg under the tongue See admin instructions. Place 1 tablet under the tongue every 5 (five) minutes as needed for up to 14 days for chest pain. 07/25/17   [provider]  pantoprazole (PROTONIX) 40 MG tablet Take 1 tablet (40 mg total) by mouth as needed (for GERD). Patient not taking: Reported on 08/28/2017 02/17/17 02/17/18  Manson Passey, PA  sertraline (ZOLOFT) 50 MG tablet Take 1 tablet (50 mg total) by mouth daily. 01/11/15   Ranelle Oyster, MD  spironolactone (ALDACTONE) 25 MG tablet Take 0.5 tablets (12.5 mg total) by mouth daily. 02/18/17   Manson Passey, PA  traZODone (DESYREL) 100 MG tablet TAKE ONE TABLET AT BEDTIME Patient not taking: Reported on 08/28/2017 06/23/16   Ranelle Oyster, MD    Family History Family History  Problem Relation Age of Onset  . Cancer Mother 69       brain tumor  . Diabetes Father     Social History Social History   Tobacco Use  . Smoking status: Former Smoker  Packs/day: 1.00    Years: 30.00    Pack years: 30.00    Last attempt to quit: 08/06/2017    Years since quitting: 0.2  . Smokeless tobacco: Never Used  Substance Use Topics  . Alcohol use: Not Currently    Comment: Hx abuse, quit 01/2017  . Drug use: Not Currently     Allergies   Codeine and Penicillins   Review of Systems Review of Systems  Unable to perform ROS: Patient unresponsive     Physical Exam Updated Vital Signs BP (!) 128/101   Pulse 100   Temp (!) 79 F (26.1 C)   Resp (!) 37   Ht 4\' 10"  (1.473 m)   Wt 49.6 kg (109 lb 5.6 oz) Comment: from July 2019 OV records  SpO2 98%   BMI 22.85 kg/m   Physical Exam  Constitutional: She appears well-developed.  HENT:  Head: Atraumatic.  Eyes:  3 mm and equal, nonreactive  Neck: Neck supple.  Cardiovascular: Normal rate.  Pulmonary/Chest:  Intubated  Abdominal: Soft. She exhibits distension.    Neurological:  GCS 3  Skin: Skin is warm.  Nursing note and vitals reviewed.    ED Treatments / Results  Labs (all labs ordered are listed, but only abnormal results are displayed) Labs Reviewed  COMPREHENSIVE METABOLIC PANEL - Abnormal; Notable for the following components:      Result Value   Sodium 146 (*)    Chloride 116 (*)    CO2 11 (*)    Glucose, Bld 302 (*)    Creatinine, Ser 1.24 (*)    Calcium 7.3 (*)    Total Protein 3.5 (*)    Albumin 2.0 (*)    AST 56 (*)    GFR calc non Af Amer 50 (*)    GFR calc Af Amer 58 (*)    Anion gap 19 (*)    All other components within normal limits  TROPONIN I - Abnormal; Notable for the following components:   Troponin I 0.10 (*)    All other components within normal limits  CBC WITH DIFFERENTIAL/PLATELET - Abnormal; Notable for the following components:   WBC 12.7 (*)    RBC 3.27 (*)    Hemoglobin 10.0 (*)    HCT 35.9 (*)    MCV 109.8 (*)    MCHC 27.9 (*)    Lymphs Abs 6.5 (*)    All other components within normal limits  BRAIN NATRIURETIC PEPTIDE - Abnormal; Notable for the following components:   B Natriuretic Peptide 797.6 (*)    All other components within normal limits  PROTIME-INR - Abnormal; Notable for the following components:   Prothrombin Time 23.1 (*)    All other components within normal limits  I-STAT CG4 LACTIC ACID, ED - Abnormal; Notable for the following components:   Lactic Acid, Venous 14.85 (*)    All other components within normal limits  I-STAT CHEM 8, ED - Abnormal; Notable for the following components:   Glucose, Bld 280 (*)    Calcium, Ion 0.99 (*)    TCO2 14 (*)    Hemoglobin 9.5 (*)    HCT 28.0 (*)    All other components within normal limits  I-STAT TROPONIN, ED - Abnormal; Notable for the following components:   Troponin i, poc 0.15 (*)    All other components within normal limits  I-STAT ARTERIAL BLOOD GAS, ED - Abnormal; Notable for the following components:   pH, Arterial 6.832 (*)  pCO2 arterial 70.4 (*)    pO2, Arterial 137.0 (*)    Bicarbonate 11.8 (*)    TCO2 14 (*)    Acid-base deficit 22.0 (*)    All other components within normal limits  CULTURE, BLOOD (ROUTINE X 2)  CULTURE, BLOOD (ROUTINE X 2)  URINALYSIS, ROUTINE W REFLEX MICROSCOPIC  TROPONIN I  TROPONIN I  TROPONIN I  MAGNESIUM  PHOSPHORUS  PATHOLOGIST SMEAR REVIEW  I-STAT VENOUS BLOOD GAS, ED  I-STAT CG4 LACTIC ACID, ED  TYPE AND SCREEN    EKG EKG Interpretation  Date/Time:  Thursday 2017-12-03 06:46:07 EDT Ventricular Rate:  96 PR Interval:    QRS Duration: 112 QT Interval:  368 QTC Calculation: 465 R Axis:   -26 Text Interpretation:  Sinus rhythm LVH with secondary repolarization abnormality Probable lateral infarct, age indeterminate Anterior Q waves, possibly due to LVH ST elevation in avR diffuse ST depression T wave inversion in anterior and lateral leads Confirmed by Derwood Kaplan ([id number]) on 12-03-2017 7:48:33 AM   Radiology Dg Chest Portable 1 View  Result Date: 12/03/17 CLINICAL DATA:  Intubation. EXAM: PORTABLE CHEST 1 VIEW COMPARISON:  Radiographs and CT 08/28/2017 FINDINGS: Endotracheal tube 3.4 cm above the carina. Enteric tube in place with tip and side-port below the diaphragm. The heart is enlarged. Patchy bilateral suprahilar opacities, right greater than left. Kerley lines consistent with pulmonary edema. No pneumothorax. No evidence of pleural effusion. IMPRESSION: 1. Endotracheal tube tip 3.4 cm from the carina. Tip and side port of the enteric tube below the diaphragm in the stomach. 2. Cardiomegaly. There is pulmonary edema with Kerley B-lines. Patchy bilateral suprahilar opacities may represent alveolar edema, pneumonia or aspiration. Electronically Signed   By: Rubye Oaks M.D.   On: 12/03/17 06:59    Procedures Procedure Name: Intubation Date/Time: 12/03/17 7:51 AM Performed by: Derwood Kaplan, MD Pre-anesthesia Checklist: Patient identified,  Patient being monitored, Emergency Drugs available, Timeout performed and Suction available Oxygen Delivery Method: Non-rebreather mask Preoxygenation: Pre-oxygenation with 100% oxygen Induction Type: Rapid sequence Ventilation: Mask ventilation without difficulty Placement Confirmation: ETT inserted through vocal cords under direct vision,  CO2 detector and Breath sounds checked- equal and bilateral     ARTERIAL LINE Date/Time: 12/03/17 8:16 AM Performed by: Derwood Kaplan, MD Authorized by: Derwood Kaplan, MD   Consent:    Consent obtained:  Emergent situation Universal protocol:    Patient identity confirmed:  Arm band Indications:    Indications: hemodynamic monitoring and multiple ABGs   Pre-procedure details:    Skin preparation:  2% Chlorhexidine   Preparation: Patient was prepped and draped in sterile fashion   Procedure details:    Location:  L femoral   Needle gauge:  18 G   Number of attempts:  2 Comments:     We are able to obtain arterial blood, however unable to thread the wire. Procedure was aborted after 2 attempts. There is a small hematoma appreciated in the left inguinal region    CRITICAL CARE Performed by: Werner Labella   Total critical care time: 50 minutes  Critical care time was exclusive of separately billable procedures and treating other patients.  Critical care was necessary to treat or prevent imminent or life-threatening deterioration.  Critical care was time spent personally by me on the following activities: development of treatment plan with patient and/or surrogate as well as nursing, discussions with consultants, evaluation of patient's response to treatment, examination of patient, obtaining history from patient or surrogate, ordering and performing  treatments and interventions, ordering and review of laboratory studies, ordering and review of radiographic studies, pulse oximetry and re-evaluation of patient's  condition.\     (including critical care time)  Medications Ordered in ED Medications  aspirin suppository 300 mg (has no administration in time range)  lactated ringers bolus 1,000 mL (has no administration in time range)  sodium bicarbonate 150 mEq in dextrose 5 % 1,000 mL infusion (has no administration in time range)  norepinephrine (LEVOPHED) 4mg  in D5W premix infusion (5 mcg/min Intravenous New Bag/Given 2017-12-15 0644)  sodium bicarbonate injection 50 mEq (50 mEq Intravenous Given 12-15-17 0656)     Initial Impression / Assessment and Plan / ED Course  I have reviewed the triage vital signs and the nursing notes.  Pertinent labs & imaging results that were available during my care of the patient were reviewed by me and considered in my medical decision making (see chart for details).    52 year old female with history of CAD, CHF comes in the ER postarrest.  Patient likely had about 15 to 20 minutes of CPR, 2 rounds of epi prior to ED arrival.  Brooke Dare airway was in place, which was removed and definitive ET tube was secured.  Code STEMI was activated on the field based on the EKG findings of ST elevation in the anterior leads -with T wave inversion in the lateral leads.  Repeat EKG in the ED is improved, Dr. Tresa Endo, cardiology has canceled code STEMI.  Critical care to admit the patient. Hypothermia protocol to be initiated. In addition to cardiac arrest, underlying etiology could be respiratory (COPD).  We will also get blood cultures. Dr. Adela Lank to put in a central line.   Final Clinical Impressions(s) / ED Diagnoses   Final diagnoses:  Cardiac arrest [facility])  Cardiopulmonary arrest [facility])    ED Discharge Orders    None       Derwood Kaplan, MD 12/15/2017 [phone]

## 2017-12-23 NOTE — Progress Notes (Signed)
Attempted Aline left radial x2 with doppler and unable to obtain. Orpha BurKaty, NP made aware.

## 2017-12-23 NOTE — ED Triage Notes (Signed)
[place] Card[place]. Pt was talking to a friend prior to event. Took 1 nitro and started a nebulizer then arressted. Approx 5 minutes down until family started CPR. 15 mins of EMS CPR before ROSC was obtained. 2Lns given and 2mg  Epi given

## 2017-12-23 DEATH — deceased

## 2019-03-22 IMAGING — DX DG CHEST 1V PORT
1 series · 1 of 1 positions shown · non-contrast
Comparison: Radiographs and CT 08/28/2017

CLINICAL DATA: Intubation.

EXAM:
PORTABLE CHEST 1 VIEW

[chest]
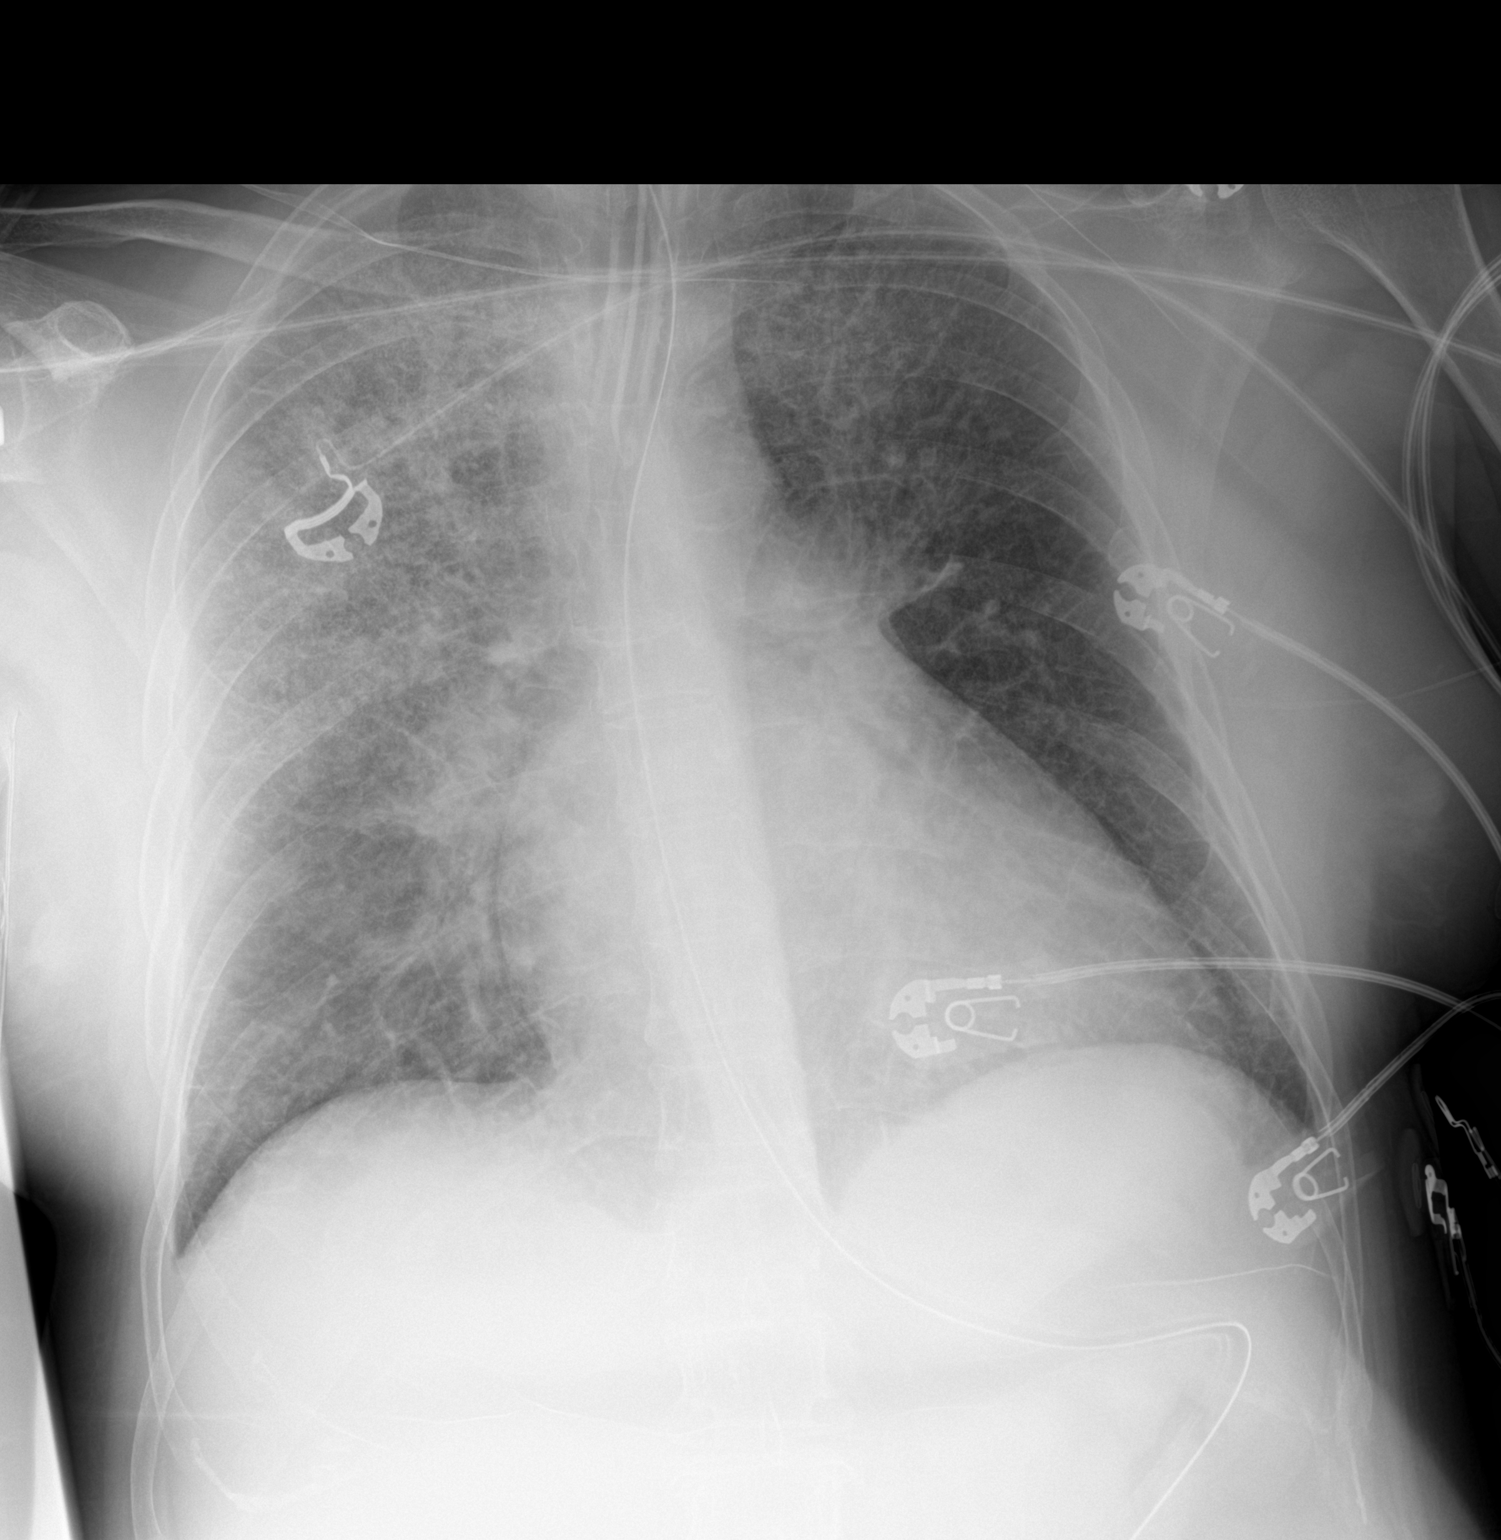

[1 of 1 positions shown; findings below may reference images not displayed]

FINDINGS: Endotracheal tube 3.4 cm above the carina. Enteric tube in place
with tip and side-port below the diaphragm. The heart is enlarged.
Patchy bilateral suprahilar opacities, right greater than left.
Kerley lines consistent with pulmonary edema. No pneumothorax. No
evidence of pleural effusion.
IMPRESSION: 1. Endotracheal tube tip 3.4 cm from the carina. Tip and side port
of the enteric tube below the diaphragm in the stomach.
2. Cardiomegaly. There is pulmonary edema with Kerley B-lines.
Patchy bilateral suprahilar opacities may represent alveolar edema,
pneumonia or aspiration.

## 2019-03-22 IMAGING — DX DG CHEST 1V PORT
1 series · 1 of 1 positions shown · non-contrast
Comparison: Is 11/22/2017

CLINICAL DATA: s/p central line

EXAM:
PORTABLE CHEST 1 VIEW

[chest]
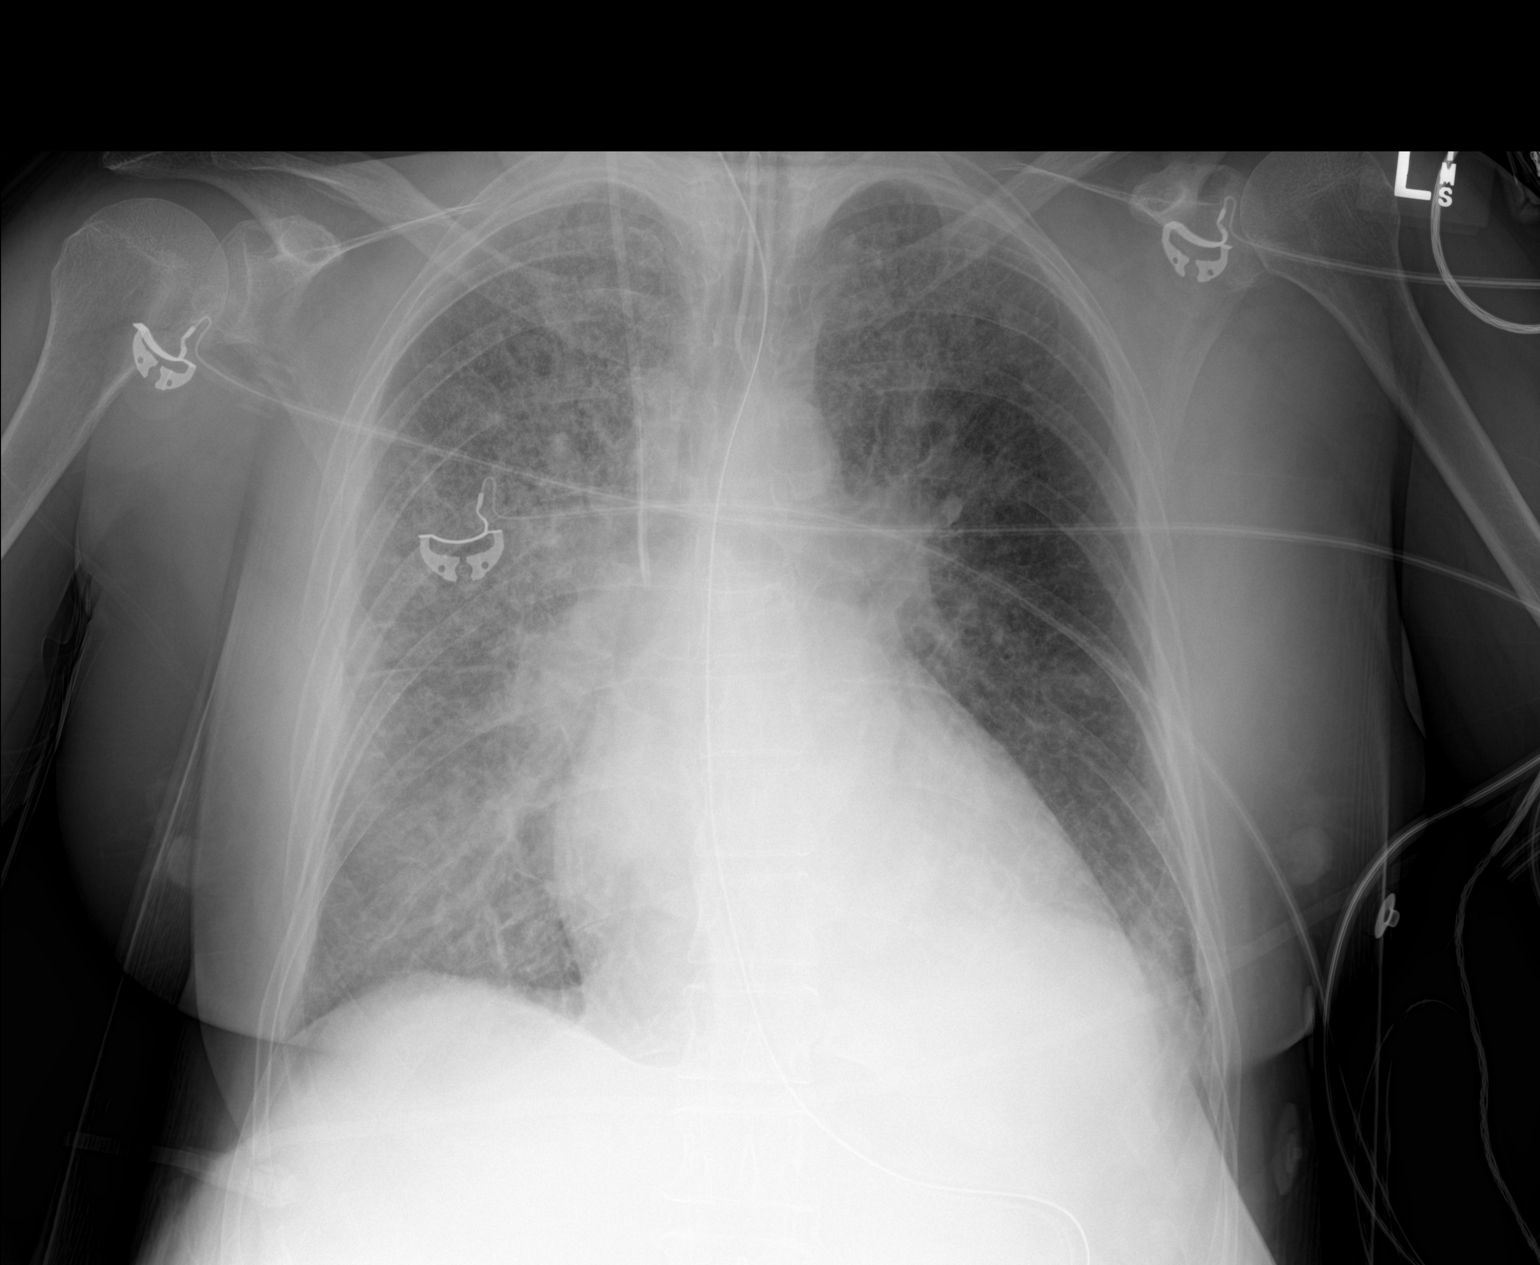

[1 of 1 positions shown; findings below may reference images not displayed]

FINDINGS: Patient has a RIGHT internal jugular central line, tip overlying the
level of superior vena cava. Endotracheal tube is in place with tip
4.2 centimeters above the carina. Nasogastric tube is in place with
tip beyond the gastroesophageal junction off the image.

The heart is enlarged. There are diffuse bilateral interstitial
markings consistent with mild edema. More confluent opacity has
developed at the LEFT lung base raising the question of overt
alveolar edema, atelectasis, or new consolidation. There is no
pneumothorax.
IMPRESSION: 1. Interval placement of RIGHT internal jugular central line. No
pneumothorax.
2. Interstitial pulmonary edema.
3. Developing opacity at the LEFT lung base, consistent with edema,
atelectasis, or new infiltrate.
